# Patient Record
Sex: Male | Born: 1970 | Race: White | Hispanic: No | Marital: Married | Smoking: Current every day smoker
Health system: Southern US, Community
[De-identification: ages and names within clinical notes are randomized; demographics above are authoritative.]

## PROBLEM LIST (undated history)

## (undated) DIAGNOSIS — G43909 Migraine, unspecified, not intractable, without status migrainosus: Secondary | ICD-10-CM

## (undated) DIAGNOSIS — I10 Essential (primary) hypertension: Secondary | ICD-10-CM

## (undated) DIAGNOSIS — L309 Dermatitis, unspecified: Secondary | ICD-10-CM

## (undated) DIAGNOSIS — E559 Vitamin D deficiency, unspecified: Secondary | ICD-10-CM

## (undated) DIAGNOSIS — J449 Chronic obstructive pulmonary disease, unspecified: Secondary | ICD-10-CM

## (undated) HISTORY — DX: Dermatitis, unspecified: L30.9

## (undated) HISTORY — DX: Essential (primary) hypertension: I10

## (undated) HISTORY — DX: Migraine, unspecified, not intractable, without status migrainosus: G43.909

## (undated) HISTORY — DX: Vitamin D deficiency, unspecified: E55.9

---

## 2006-10-19 ENCOUNTER — Emergency Department: Payer: Self-pay | Admitting: Emergency Medicine

## 2009-04-24 HISTORY — PX: DENTAL SURGERY: SHX609

## 2010-05-20 ENCOUNTER — Emergency Department: Payer: Self-pay | Admitting: Emergency Medicine

## 2010-12-29 ENCOUNTER — Emergency Department: Payer: Self-pay | Admitting: Internal Medicine

## 2011-02-06 ENCOUNTER — Emergency Department: Payer: Self-pay | Admitting: Unknown Physician Specialty

## 2014-08-19 ENCOUNTER — Ambulatory Visit
Admit: 2014-08-19 | Disposition: A | Discharge status: Home / Self Care | Payer: Self-pay | Attending: Family Medicine | Admitting: Family Medicine

## 2015-04-05 ENCOUNTER — Other Ambulatory Visit: Payer: Self-pay

## 2015-04-05 MED ORDER — CHLORTHALIDONE 50 MG PO TABS: 50.0000 mg | ORAL_TABLET | Freq: Every day | ORAL | Status: DC

## 2015-04-05 MED ORDER — OMEPRAZOLE 20 MG PO CPDR: 20.0000 mg | DELAYED_RELEASE_CAPSULE | Freq: Every day | ORAL | Status: DC | PRN

## 2015-04-05 MED ORDER — AMLODIPINE BESYLATE 10 MG PO TABS: 10.0000 mg | ORAL_TABLET | Freq: Every day | ORAL | Status: DC

## 2015-04-05 NOTE — Telephone Encounter (Signed)
Patient has f/u scheduled for 12/20 but is completely out of his BP meds.

## 2015-04-05 NOTE — Telephone Encounter (Signed)
He was last seen March 2016; he does not appear compliant with medicines, as a 3 month supply was prescribed at that time I will prescribe medicines, as he has upcoming appt next week

## 2015-04-06 ENCOUNTER — Other Ambulatory Visit: Payer: Self-pay

## 2015-04-06 MED ORDER — PROPRANOLOL HCL ER 80 MG PO CP24: 80.0000 mg | ORAL_CAPSULE | Freq: Every day | ORAL | Status: DC

## 2015-04-06 NOTE — Telephone Encounter (Signed)
His other Bp meds were refilled yesterday except this one.

## 2015-04-08 DIAGNOSIS — L309 Dermatitis, unspecified: Secondary | ICD-10-CM | POA: Insufficient documentation

## 2015-04-08 DIAGNOSIS — I10 Essential (primary) hypertension: Secondary | ICD-10-CM | POA: Insufficient documentation

## 2015-04-08 DIAGNOSIS — E559 Vitamin D deficiency, unspecified: Secondary | ICD-10-CM | POA: Insufficient documentation

## 2015-04-09 ENCOUNTER — Encounter: Payer: Self-pay | Admitting: Family Medicine

## 2015-04-09 ENCOUNTER — Other Ambulatory Visit: Payer: Self-pay

## 2015-04-09 ENCOUNTER — Telehealth: Payer: Self-pay

## 2015-04-09 ENCOUNTER — Ambulatory Visit (INDEPENDENT_AMBULATORY_CARE_PROVIDER_SITE_OTHER): Payer: Commercial Managed Care - PPO | Admitting: Family Medicine

## 2015-04-09 VITALS — BP 173/117 | HR 75 | Temp 97.9°F | Ht 68.5 in | Wt 159.0 lb

## 2015-04-09 DIAGNOSIS — D696 Thrombocytopenia, unspecified: Secondary | ICD-10-CM

## 2015-04-09 DIAGNOSIS — R079 Chest pain, unspecified: Secondary | ICD-10-CM

## 2015-04-09 DIAGNOSIS — I10 Essential (primary) hypertension: Secondary | ICD-10-CM

## 2015-04-09 DIAGNOSIS — E559 Vitamin D deficiency, unspecified: Secondary | ICD-10-CM

## 2015-04-09 DIAGNOSIS — D709 Neutropenia, unspecified: Secondary | ICD-10-CM | POA: Diagnosis not present

## 2015-04-09 DIAGNOSIS — R9431 Abnormal electrocardiogram [ECG] [EKG]: Secondary | ICD-10-CM

## 2015-04-09 DIAGNOSIS — Z720 Tobacco use: Secondary | ICD-10-CM

## 2015-04-09 LAB — CBC WITH DIFFERENTIAL/PLATELET
Hematocrit: 49.2 % (ref 37.5–51.0)
Hemoglobin: 17.2 g/dL (ref 12.6–17.7)
Lymphocytes Absolute: 1.7 10*3/uL (ref 0.7–3.1)
Lymphs: 25 %
MCH: 34.5 pg — ABNORMAL HIGH (ref 26.6–33.0)
MCHC: 35 g/dL (ref 31.5–35.7)
MCV: 99 fL — ABNORMAL HIGH (ref 79–97)
MID (ABSOLUTE): 0.5 10*3/uL (ref 0.1–1.6)
MID: 7 %
NEUTROS ABS: 4.5 10*3/uL (ref 1.4–7.0)
Neutrophils: 68 %
Platelets: 222 10*3/uL (ref 150–379)
RBC: 4.99 x10E6/uL (ref 4.14–5.80)
RDW: 12.9 % (ref 12.3–15.4)
WBC: 6.7 10*3/uL (ref 3.4–10.8)

## 2015-04-09 LAB — LIPID PANEL PICCOLO, WAIVED
CHOL/HDL RATIO PICCOLO,WAIVE: 2.1 mg/dL
CHOLESTEROL PICCOLO, WAIVED: 139 mg/dL (ref <200)
HDL Chol Piccolo, Waived: 68 mg/dL (ref >59)
LDL Chol Calc Piccolo Waived: 62 mg/dL (ref <100)
TRIGLYCERIDES PICCOLO,WAIVED: 48 mg/dL (ref <150)
VLDL Chol Calc Piccolo,Waive: 10 mg/dL (ref <30)

## 2015-04-09 LAB — TROPONIN I: Troponin I: <0.01 ng/mL (ref 0.00–0.04)

## 2015-04-09 NOTE — Patient Instructions (Addendum)
Your goal blood pressure is less than 140 mmHg on top. Try to follow the DASH guidelines (DASH stands for Dietary Approaches to Stop Hypertension) Try to limit the sodium in your diet.  Ideally, consume less than 1.5 grams (less than 1,500mg ) per day. Do not add salt when cooking or at the table.  Check the sodium amount on labels when shopping, and choose items lower in sodium when given a choice. Avoid or limit foods that already contain a lot of sodium. Eat a diet rich in fruits and vegetables and whole grains. We'll have you see the cardiologist next week If you develop chest pain again, call 911 Avoid strenuous activity in the meantime Really try to quit smoking; smoking increases your risk of stroke and heart attack and raises your blood pressure  DASH Eating Plan DASH stands for "Dietary Approaches to Stop Hypertension." The DASH eating plan is a healthy eating plan that has been shown to reduce high blood pressure (hypertension). Additional health benefits may include reducing the risk of type 2 diabetes mellitus, heart disease, and stroke. The DASH eating plan may also help with weight loss. WHAT DO I NEED TO KNOW ABOUT THE DASH EATING PLAN? For the DASH eating plan, you will follow these general guidelines:  Choose foods with a percent daily value for sodium of less than 5% (as listed on the food label).  Use salt-free seasonings or herbs instead of table salt or sea salt.  Check with your health care provider or pharmacist before using salt substitutes.  Eat lower-sodium products, often labeled as "lower sodium" or "no salt added."  Eat fresh foods.  Eat more vegetables, fruits, and low-fat dairy products.  Choose whole grains. Look for the word "whole" as the first word in the ingredient list.  Choose fish and skinless chicken or [place]key more often than red meat. Limit fish, poultry, and meat to 6 oz (170 g) each day.  Limit sweets, desserts, sugars, and sugary  drinks.  Choose heart-healthy fats.  Limit cheese to 1 oz (28 g) per day.  Eat more home-cooked food and less restaurant, buffet, and fast food.  Limit fried foods.  Cook foods using methods other than frying.  Limit canned vegetables. If you do use them, rinse them well to decrease the sodium.  When eating at a restaurant, ask that your food be prepared with less salt, or no salt if possible. WHAT FOODS CAN I EAT? Seek help from a dietitian for individual calorie needs. Grains Whole grain or whole wheat bread. Brown rice. Whole grain or whole wheat pasta. Quinoa, bulgur, and whole grain cereals. Low-sodium cereals. Corn or whole wheat flour tortillas. Whole grain cornbread. Whole grain crackers. Low-sodium crackers. Vegetables Fresh or frozen vegetables (raw, steamed, roasted, or grilled). Low-sodium or reduced-sodium tomato and vegetable juices. Low-sodium or reduced-sodium tomato sauce and paste. Low-sodium or reduced-sodium canned vegetables.  Fruits All fresh, canned (in natural juice), or frozen fruits. Meat and Other Protein Products Ground beef (85% or leaner), grass-fed beef, or beef trimmed of fat. Skinless chicken or [place]key. Ground chicken or [place]key. Pork trimmed of fat. All fish and seafood. Eggs. Dried beans, peas, or lentils. Unsalted nuts and seeds. Unsalted canned beans. Dairy Low-fat dairy products, such as skim or 1% milk, 2% or reduced-fat cheeses, low-fat ricotta or cottage cheese, or plain low-fat yogurt. Low-sodium or reduced-sodium cheeses. Fats and Oils Tub margarines without trans fats. Light or reduced-fat mayonnaise and salad dressings (reduced sodium). Avocado. Safflower, olive, or canola  oils. Natural peanut or almond butter. Other Unsalted popcorn and pretzels. The items listed above may not be a complete list of recommended foods or beverages. Contact your dietitian for more options. WHAT FOODS ARE NOT RECOMMENDED? Grains White bread. White pasta.  White rice. Refined cornbread. Bagels and croissants. Crackers that contain trans fat. Vegetables Creamed or fried vegetables. Vegetables in a cheese sauce. Regular canned vegetables. Regular canned tomato sauce and paste. Regular tomato and vegetable juices. Fruits Dried fruits. Canned fruit in light or heavy syrup. Fruit juice. Meat and Other Protein Products Fatty cuts of meat. Ribs, chicken wings, bacon, sausage, bologna, salami, chitterlings, fatback, hot dogs, bratwurst, and packaged luncheon meats. Salted nuts and seeds. Canned beans with salt. Dairy Whole or 2% milk, cream, half-and-half, and cream cheese. Whole-fat or sweetened yogurt. Full-fat cheeses or blue cheese. Nondairy creamers and whipped toppings. Processed cheese, cheese spreads, or cheese curds. Condiments Onion and garlic salt, seasoned salt, table salt, and sea salt. Canned and packaged gravies. Worcestershire sauce. Tartar sauce. Barbecue sauce. Teriyaki sauce. Soy sauce, including reduced sodium. Steak sauce. Fish sauce. Oyster sauce. Cocktail sauce. Horseradish. Ketchup and mustard. Meat flavorings and tenderizers. Bouillon cubes. Hot sauce. Tabasco sauce. Marinades. Taco seasonings. Relishes. Fats and Oils Butter, stick margarine, lard, shortening, ghee, and bacon fat. Coconut, palm kernel, or palm oils. Regular salad dressings. Other Pickles and olives. Salted popcorn and pretzels. The items listed above may not be a complete list of foods and beverages to avoid. Contact your dietitian for more information. WHERE CAN I FIND MORE INFORMATION? National Heart, Lung, and Blood Institute: travelstabloid.com   This information is not intended to replace advice given to you by your health care provider. Make sure you discuss any questions you have with your health care provider.   Document Released: 03/30/2011 Document Revised: 05/01/2014 Document Reviewed: 02/12/2013 Elsevier Interactive  Patient Education [employer].

## 2015-04-09 NOTE — Progress Notes (Signed)
BP 173/117 mmHg  Pulse 75  Temp(Src) 97.9 F (36.6 C)  Ht 5' 8.5" (1.74 m)  Wt 159 lb (72.122 kg)  BMI 23.82 kg/m2  SpO2 100%   Subjective:    Patient ID: Anthony Cardenas., male    DOB: 11-06-1970, 44 y.o.   MRN: [id number]  HPI: Anthony Cardenas. is a 44 y.o. male  Chief Complaint  Patient presents with  . Hypertension    follow up for BP and med refills   No issues since last visit; he was apparently lost to follow-up; I received a phone note/refill request Dec 12th, out of medicines and patient made appt for Dec 20th; meds were refilled and he has back on his medicine for 3 days now and took medicines this morning Out of blood pressure pills for two weeks; his pressure was doing well until he ran out He could definitely tell that his blood pressure was getting worse; he felt dizzy last night; forehead felt warm; no headache; no vision changes We reviewed his old chart (we changed EMR systems more than 6 months ago, so all of his labs and imaging were in that system) He had a renal ultrasound; April, simple 2.1 cm cyst on left kidney, otherwise normal He does not get episodes of anger or facial flushing; has had episodes of heart beating fast He has had some chest pain in the last week; NO chest pain right now; no associated nausea; no shortness of breath; he has had two episodes of chest pain, each lasting about 30-40 minutes; the last episode was Tuesday (today is Friday) He continues to smoke; he says he cannot tolerate nicotine patch because heart will race; smoking 1/2 to 1 ppd; he is aware that smoking can lead to heart attacks and strokes; he tried Chantix but it gave him really vivid dreams; hard to sleep sometimes, so he quit the Chantix Used to lift a lot of weights, and did protein shakes and drinks, never did creatinine Very little hair on his arms and legs, he's always been that way, nothing new He has hx of very low vitamin D deficiency and dermatitis; that has not  be rechecked in many months  Relevant past medical, surgical, family and social history reviewed and updated as indicated. His mother has high blood pressure; the only member in the family with heart disease is a maternal grandmother Interim medical history since our last visit reviewed. Family History  Problem Relation Age of Onset  . Cancer Mother     lung  . COPD Mother   . Asthma Mother   . Hypertension Mother   . Cancer Paternal Grandfather     lung  . Heart disease Maternal Grandmother   . Hypertension Maternal Grandmother   . Stroke Maternal Grandmother   . Diabetes Neg Hx    Allergies and medications reviewed and updated.  Review of Systems  Cardiovascular: Positive for chest pain (a few times this past week; mild; left side of chest; 30-40 minutes; has happened in the past; has not seen cardiologist).  Per HPI unless specifically indicated above     Objective:    BP 173/117 mmHg  Pulse 75  Temp(Src) 97.9 F (36.6 C)  Ht 5' 8.5" (1.74 m)  Wt 159 lb (72.122 kg)  BMI 23.82 kg/m2  SpO2 100%  Wt Readings from Last 3 Encounters:  04/09/15 159 lb (72.122 kg)  07/02/14 156 lb (70.761 kg)    Today's Vitals   04/09/15  1019 04/09/15 1102  BP: 165/107 173/117  Pulse: 75 75  Temp: 97.9 F (36.6 C)   Height: 5' 8.5" (1.74 m)   Weight: 159 lb (72.122 kg)   SpO2: 100%    Physical Exam  Constitutional: He appears well-developed and well-nourished.  Eyes: EOM are normal. No scleral icterus.  Neck: No JVD present. No thyromegaly present.  Cardiovascular: Normal rate, regular rhythm and normal heart sounds.   No extrasystoles are present. PMI is not displaced.  Exam reveals no gallop.   No murmur heard. Pulmonary/Chest: Effort normal and breath sounds normal. He has no decreased breath sounds.  Abdominal: He exhibits no distension and no abdominal bruit. There is no tenderness.  Musculoskeletal: He exhibits no edema.  Neurological: He displays no tremor.  Reflex  Scores:      Patellar reflexes are 2+ on the right side and 2+ on the left side. Skin: Rash (mild dermatitis, scale and erythema, face similar to seborrhea) noted. No pallor.  Psychiatric: His mood appears not anxious. He does not exhibit a depressed mood.  Pleasant and cooperative      Assessment & Plan:   Problem List Items Addressed This Visit      Cardiovascular and Mediastinum   Hypertension (Chronic)    He has had the renal US and I personally reviewed that report today; he also had renal artery duplex, but I could not locate that result in the chart; we called over to [facility] and they sent over the fax, and it did NOT show any renal artery stenosis (no athero, no fibromuscular dysplasia); will check labs to r/o pheo since he describes some panic-type episodes; check renin and aldosterone and UPEP and SPEP, check EKG today and refer to cardiologist; patient has been back on his medicines now for 3 days; will continue same doses since he reports that his BP was well-controlled during the months he has been away until he ran out; stressed importance of quitting smoking      Relevant Orders   Comprehensive metabolic panel (Completed)   TSH (Completed)   Metanephrines, Urine, 24 hour   Catecholamines, fractionated, Urine, 24 hour   Aldosterone + renin activity w/ ratio (Completed)   Lipid Panel Piccolo, Waived (Completed)   Troponin I (Completed)   Microalbumin / creatinine urine ratio (Completed)     Other   Vitamin D deficiency disease    Check level today and replace if indicated; can cause dermatitis if significant deficiency      Relevant Orders   VITAMIN D 25 Hydroxy (Vit-D Deficiency, Fractures) (Completed)   Chest pain - Primary    Two episodes of chest pain this week; my suspicion is that when he ran out of medicine (including his beta-blocker) that he may have had some very high pressures, rebound tachycardia and could have suffered demand ischemia;  discussed with cardiologist; referral and stress test, cards says okay to wait until next week, getting stat trop I today and to hospital if positive; discussed with patient, reasons to call 911 reviewed      Relevant Orders   EKG 12-Lead (Completed)   Ambulatory referral to Cardiology   CBC With Differential/Platelet (Completed)   Comprehensive metabolic panel (Completed)   TSH (Completed)   Metanephrines, Urine, 24 hour   Catecholamines, fractionated, Urine, 24 hour   Aldosterone + renin activity w/ ratio (Completed)   Lipid Panel Piccolo, Waived (Completed)   Troponin I (Completed)   Tobacco abuse    Patient  is not ready to quit smoking unfortunately; he is aware that smoking can increase risk of heart attack and stroke; I am here to help if and when he is ready to stop smoking if he would like assistance       Other Visit Diagnoses    Neutropenia, unspecified type (HCC)        Relevant Orders    CBC With Differential/Platelet (Completed)    Thrombocytopenia (HCC)        Relevant Orders    CBC With Differential/Platelet (Completed)    Abnormal EKG        J point elevation, LVH; reviewed EKG with cardiologist by phone; he says okay for pt to wait until next week to be seen; stat trop I drawn, forward to cards       Follow up plan: Return in about 1 week (around 04/16/2015) for hypertension.  Face-to-face time with patient was more than 45 minutes, >50% time spent counseling and coordination of care; I personally spoke with staff at [facility] and I personally spoke with cardiologist about his case Orders Placed This Encounter  Procedures  . CBC With Differential/Platelet  . Comprehensive metabolic panel  . TSH  . Metanephrines, Urine, 24 hour  . Catecholamines, fractionated, Urine, 24 hour  . Aldosterone + renin activity w/ ratio  . Lipid Panel Piccolo, [place]  . Troponin I  . VITAMIN D 25 Hydroxy (Vit-D Deficiency, Fractures)  . Microalbumin /  creatinine urine ratio  . Comprehensive metabolic panel  . Aldosterone + renin activity w/ ratio  . TSH  . Troponin I  . VITAMIN D 25 Hydroxy (Vit-D Deficiency, Fractures)  . Comprehensive metabolic panel  . Microalbumin / creatinine urine ratio  . TSH  . VITAMIN D 25 Hydroxy (Vit-D Deficiency, Fractures)  . Microalbumin / creatinine urine ratio  . Ambulatory referral to Cardiology  . EKG 12-Lead  (I do not know why there are so many duplicates for some of the above labs. I originally only entered them once. There are multiples in there now as I complete my note, so I am having to sign off on them.) Only the trop I was STAT  An after-visit summary was printed and given to the patient at check-out.  Please see the patient instructions which may contain other information and recommendations beyond what is mentioned above in the assessment and plan.

## 2015-04-09 NOTE — Telephone Encounter (Signed)
l mom to confirm pt of treadmill appt 12/19 10:30

## 2015-04-09 NOTE — Assessment & Plan Note (Addendum)
Check level today and replace if indicated; can cause dermatitis if significant deficiency

## 2015-04-09 NOTE — Assessment & Plan Note (Addendum)
He has had the renal US and I personally reviewed that report today; he also had renal artery duplex, but I could not locate that result in the chart; we called over to [facility] and they sent over the fax, and it did NOT show any renal artery stenosis (no athero, no fibromuscular dysplasia); will check labs to r/o pheo since he describes some panic-type episodes; check renin and aldosterone and UPEP and SPEP, check EKG today and refer to cardiologist; patient has been back on his medicines now for 3 days; will continue same doses since he reports that his BP was well-controlled during the months he has been away until he ran out; stressed importance of quitting smoking

## 2015-04-10 LAB — COMPREHENSIVE METABOLIC PANEL
ALBUMIN: 5 g/dL (ref 3.5–5.5)
ALT: 18 IU/L (ref 0–44)
AST: 21 IU/L (ref 0–40)
Albumin/Globulin Ratio: 2.5 (ref 1.1–2.5)
Alkaline Phosphatase: 63 IU/L (ref 39–117)
BUN/Creatinine Ratio: 12 (ref 9–20)
BUN: 13 mg/dL (ref 6–24)
Bilirubin Total: 0.4 mg/dL (ref 0.0–1.2)
CALCIUM: 10.3 mg/dL — AB (ref 8.7–10.2)
CO2: 25 mmol/L (ref 18–29)
Chloride: 97 mmol/L (ref 96–106)
Creatinine, Ser: 1.08 mg/dL (ref 0.76–1.27)
GFR, EST AFRICAN AMERICAN: 97 mL/min/{1.73_m2} (ref >59)
GFR, EST NON AFRICAN AMERICAN: 84 mL/min/{1.73_m2} (ref >59)
GLOBULIN, TOTAL: 2 g/dL (ref 1.5–4.5)
Glucose: 81 mg/dL (ref 65–99)
POTASSIUM: 4.7 mmol/L (ref 3.5–5.2)
SODIUM: 141 mmol/L (ref 134–144)
TOTAL PROTEIN: 7 g/dL (ref 6.0–8.5)

## 2015-04-10 LAB — VITAMIN D 25 HYDROXY (VIT D DEFICIENCY, FRACTURES): Vit D, 25-Hydroxy: 12.2 ng/mL — ABNORMAL LOW (ref 30.0–100.0)

## 2015-04-10 LAB — MICROALBUMIN / CREATININE URINE RATIO: Creatinine, Urine: 56.3 mg/dL

## 2015-04-10 LAB — ALDOSTERONE + RENIN ACTIVITY W/ RATIO

## 2015-04-10 LAB — TSH: TSH: 2.24 u[IU]/mL (ref 0.450–4.500)

## 2015-04-10 LAB — TROPONIN I

## 2015-04-11 ENCOUNTER — Other Ambulatory Visit: Payer: Self-pay | Admitting: Family Medicine

## 2015-04-11 DIAGNOSIS — Z720 Tobacco use: Secondary | ICD-10-CM | POA: Insufficient documentation

## 2015-04-11 MED ORDER — VITAMIN D (ERGOCALCIFEROL) 1.25 MG (50000 UNIT) PO CAPS: 50000.0000 [IU] | ORAL_CAPSULE | ORAL | Status: DC

## 2015-04-11 NOTE — Assessment & Plan Note (Signed)
Two episodes of chest pain this week; my suspicion is that when he ran out of medicine (including his beta-blocker) that he may have had some very high pressures, rebound tachycardia and could have suffered demand ischemia; discussed with cardiologist; referral and stress test, cards says okay to wait until next week, getting stat trop I today and to hospital if positive; discussed with patient, reasons to call 911 reviewed

## 2015-04-11 NOTE — Assessment & Plan Note (Signed)
Patient is not ready to quit smoking unfortunately; he is aware that smoking can increase risk of heart attack and stroke; I am here to help if and when he is ready to stop smoking if he would like assistance

## 2015-04-13 ENCOUNTER — Ambulatory Visit: Payer: Self-pay | Admitting: Family Medicine

## 2015-04-13 ENCOUNTER — Encounter (INDEPENDENT_AMBULATORY_CARE_PROVIDER_SITE_OTHER): Payer: Commercial Managed Care - PPO

## 2015-04-13 ENCOUNTER — Telehealth: Payer: Self-pay

## 2015-04-13 MED ORDER — LOSARTAN POTASSIUM 50 MG PO TABS: 50.0000 mg | ORAL_TABLET | Freq: Every day | ORAL | Status: DC

## 2015-04-13 NOTE — Telephone Encounter (Signed)
Called and left Jasmine DecemberSharon, Charity fundraiserN, a voicemail asking for her to please return my call.

## 2015-04-13 NOTE — Telephone Encounter (Signed)
S/w [place], CMA, [facility] who states Dr. Sherie DonLada will add losartan 50mg  qd. Pt will have f/u visit w/Dr. Sherie DonLada next week and pt/PCP will contact us to reschedule GXT once BP controlled.

## 2015-04-13 NOTE — Telephone Encounter (Signed)
Please have patient ADD the additional blood pressure medicine, losartan, and reschedule with Dr. Kirke CorinArida ASAP Please let Dr. Jari SportsmanArida's office know we've added this medicine Have patient make f/u with me next week

## 2015-04-13 NOTE — Telephone Encounter (Signed)
Jasmine DecemberSharon returned my call. I let her know that Dr. Sherie DonLada started the patient on a new medication today, Losartan. I told Jasmine DecemberSharon that Dr. Sherie DonLada wants the patient to come in and follow-up with her next week. Jasmine DecemberSharon stated that in order for them to be able to do the stress test, the patient's diastolic number has to be under 100. So when he comes in next week and if his blood pressure is within range for the stress test, Jasmine DecemberSharon asked to have the patient give them a call to reschedule his test. I am still waiting on a returned call from the patient.

## 2015-04-13 NOTE — Telephone Encounter (Signed)
Left message on VM for Amy, CMA for Dr. Sherie DonLada, of pt elevated BP and inability to have GXT. Requested CB

## 2015-04-13 NOTE — Telephone Encounter (Signed)
Pt arrived for GXT this morning. His BP readings were 150/103 and 146/106.  Pt states he ran out of BP meds and was unable to afford refills.  He was without medications for two weeks. Refills submitted on 12/12 by PCP and pt had 12/16 OV w/Dr. Sherie DonLada. He takes amlodipine, chlorthalidone, and propranolol.  He has been at work since 4:30am and has smoked today. Unable to do GXT d/t BP. Education material given on low sodium diet, HTN, and smoking cessation. Will make  Dr. Sherie DonLada aware of BP issue and need to reschedule test. Forward to Dr. Kirke CorinArida

## 2015-04-13 NOTE — Telephone Encounter (Signed)
Patient returned call. I let him know that Dr. Sherie DonLada started him on Losartan and it was at his pharmacy, and that she wants him to reschedule his appointment with Dr. Kirke CorinArida asap. I also told the patient that Dr. Sherie DonLada wants him to schedule a follow-up visit with her next week. Patient stated he would call back tomorrow to schedule an appointment with Dr. Sherie DonLada because he needed to look at his schedule.

## 2015-04-13 NOTE — Telephone Encounter (Signed)
Called and left patient a voicemail asking for him to please return my call.  

## 2015-04-13 NOTE — Telephone Encounter (Signed)
Received CB from Amy w/[facility]. She will forward info to Dr. Sherie DonLada for further review and recommendations.

## 2015-04-13 NOTE — Telephone Encounter (Signed)
Patient and Jasmine DecemberSharon, RN with Dr. Jari SportsmanArida's office both left messages stating that they were unable to do his stress test today because his BP was still too high. It was 150/103 in the left arm and 146/102 in the right arm. They wanted to let you know and see if you wanted to make adjustments to his BP med. Dr. Bradly ChrisArdia's nurse is also letting Dr. Kirke CorinArida know.

## 2015-04-15 ENCOUNTER — Telehealth: Payer: Self-pay | Admitting: Family Medicine

## 2015-04-15 ENCOUNTER — Encounter: Payer: Self-pay | Admitting: *Deleted

## 2015-04-15 NOTE — Telephone Encounter (Signed)
Yes, but now it looks like they are trying to recontact him again to reschedule now that I've added the BP medicine

## 2015-04-15 NOTE — Telephone Encounter (Signed)
See letter from [facility]; they cannot reach patient; please try to reach him, if needed, leave msg to have him call them right away

## 2015-04-15 NOTE — Telephone Encounter (Signed)
Left message to call.

## 2015-04-15 NOTE — Telephone Encounter (Signed)
That's very interesting because he went there on the 20th for a stress test with Dr. Kirke CorinArida and they could not do it because of his BP being too high.

## 2015-04-15 NOTE — Telephone Encounter (Signed)
Patient notified, he will call them back, he was waiting on his updated work schedule.

## 2015-04-16 LAB — METANEPHRINES, URINE, 24 HOUR
METANEPHRINES 24H UR: 72 ug/(24.h) (ref 45–290)
Metaneph Total, Ur: 55 ug/L
Normetanephrine, 24H Ur: 203 ug/24 hr (ref 82–500)
Normetanephrine, Ur: 156 ug/L

## 2015-04-17 LAB — CATECHOLAMINES, FRACTIONATED, URINE, 24 HOUR
Dopamine , 24H Ur: 75 ug/24 hr (ref 0–510)
Dopamine, Rand Ur: 58 ug/L
Epinephrine, 24H Ur: 4 ug/24 hr (ref 0–20)
Epinephrine, Rand Ur: 3 ug/L
NOREPINEPH RAND UR: 21 ug/L
Norepinephrine, 24H Ur: 27 ug/24 hr (ref 0–135)

## 2015-04-22 LAB — ALDOSTERONE + RENIN ACTIVITY W/ RATIO: ALDOSTERONE: 9.8 ng/dL (ref 0.0–30.0)

## 2015-04-27 ENCOUNTER — Other Ambulatory Visit: Payer: Self-pay

## 2015-04-27 ENCOUNTER — Ambulatory Visit (INDEPENDENT_AMBULATORY_CARE_PROVIDER_SITE_OTHER): Payer: Commercial Managed Care - PPO

## 2015-04-27 DIAGNOSIS — R079 Chest pain, unspecified: Secondary | ICD-10-CM

## 2015-04-28 ENCOUNTER — Telehealth: Payer: Self-pay | Admitting: Family Medicine

## 2015-04-28 NOTE — Telephone Encounter (Signed)
Please let Anthony Buckleralph E Levels Jr. know that I'd like to see patient for an appointment here in the office for:  High blood pressure Please schedule a FIFTEEN visit with me in the next: week Fasting?  No Thank you, Dr. Sherie DonLada (He was supposed to be seen around Dec 23rd)

## 2015-04-29 ENCOUNTER — Telehealth: Payer: Self-pay

## 2015-04-29 LAB — EXERCISE TOLERANCE TEST
CHL CUP MPHR: 177 {beats}/min
CHL CUP RESTING HR STRESS: 82 {beats}/min
CSEPHR: 105 %
Estimated workload: 10.4 METS
Exercise duration (min): 9 min
Exercise duration (sec): 12 s
Peak HR: 187 {beats}/min

## 2015-04-29 NOTE — Telephone Encounter (Signed)
Please call patient with results of stress test.

## 2015-04-29 NOTE — Telephone Encounter (Signed)
Patient wants you or Dr. lada to call him regarding his stress test, if Dr. lada got them yet. He has to look at his schedule next week to see when he can book the f/u appt.

## 2015-04-29 NOTE — Telephone Encounter (Signed)
Routing to provider  

## 2015-04-29 NOTE — Telephone Encounter (Signed)
I spoke with their office, they said it is not finalized yet, but they will send a message to the provider and they will call the patient with the results.

## 2015-04-29 NOTE — Telephone Encounter (Signed)
Left vm on patient phone to call us back and schedule a 15 minute f/u for High blood pressure.

## 2015-04-29 NOTE — Telephone Encounter (Signed)
The status says "in process" and the interpretation and conclusions of the test are blank in what I reviewed; it doesn't look like it has been finalized; please call Dr. Jari SportsmanArida's office and find out if they can tell you if it was positive or negative

## 2015-04-29 NOTE — Telephone Encounter (Signed)
Patient notified that Cardiology will call him with the results. I advised him if he had not heard from them by Monday, to let us know.

## 2015-04-30 NOTE — Telephone Encounter (Signed)
Per verbal from Dr. Mariah MillingGollan, GXT was normal. Informed pt of results. Pt verbalized understanding with no further questions.

## 2015-05-04 ENCOUNTER — Other Ambulatory Visit: Payer: Self-pay

## 2015-05-04 MED ORDER — PROPRANOLOL HCL ER 80 MG PO CP24: 80.0000 mg | ORAL_CAPSULE | Freq: Every day | ORAL | Status: DC

## 2015-05-04 MED ORDER — LOSARTAN POTASSIUM 50 MG PO TABS: 50.0000 mg | ORAL_TABLET | Freq: Every day | ORAL | Status: DC

## 2015-05-04 MED ORDER — AMLODIPINE BESYLATE 10 MG PO TABS: 10.0000 mg | ORAL_TABLET | Freq: Every day | ORAL | Status: DC

## 2015-05-04 MED ORDER — CHLORTHALIDONE 50 MG PO TABS: 50.0000 mg | ORAL_TABLET | Freq: Every day | ORAL | Status: DC

## 2015-05-04 NOTE — Telephone Encounter (Signed)
He needs one more refill on his BP meds, he will have his work schedule later this week and will call and schedule his follow up appointment as soon as he gets it.

## 2015-05-04 NOTE — Telephone Encounter (Signed)
Rxs sent Limited for ones that needs labs 6 months for those that don't need labs

## 2015-05-18 ENCOUNTER — Ambulatory Visit (INDEPENDENT_AMBULATORY_CARE_PROVIDER_SITE_OTHER): Payer: Commercial Managed Care - PPO | Admitting: Family Medicine

## 2015-05-18 ENCOUNTER — Encounter: Payer: Self-pay | Admitting: Family Medicine

## 2015-05-18 VITALS — BP 120/83 | HR 69 | Temp 97.6°F | Wt 162.0 lb

## 2015-05-18 DIAGNOSIS — I10 Essential (primary) hypertension: Secondary | ICD-10-CM

## 2015-05-18 DIAGNOSIS — E559 Vitamin D deficiency, unspecified: Secondary | ICD-10-CM

## 2015-05-18 DIAGNOSIS — Z5181 Encounter for therapeutic drug level monitoring: Secondary | ICD-10-CM

## 2015-05-18 DIAGNOSIS — Z720 Tobacco use: Secondary | ICD-10-CM

## 2015-05-18 NOTE — Assessment & Plan Note (Signed)
Check BMP today 

## 2015-05-18 NOTE — Assessment & Plan Note (Signed)
Controlled today; I'm so proud of patient's dietary changes; continue current medicine; as he continues to watch his diet and limit sodium, we may be able to decrease his medicine; monitor and call if trending down

## 2015-05-18 NOTE — Assessment & Plan Note (Signed)
He is not ready to quit; I am here to help if/when he's ready; see AVS for numbers given and steps to take to quit

## 2015-05-18 NOTE — Progress Notes (Signed)
BP 120/83 mmHg  Pulse 69  Temp(Src) 97.6 F (36.4 C)  Wt 162 lb (73.483 kg)  SpO2 100%   Subjective:    Patient ID: Anthony Cardenas., male    DOB: Aug 05, 1970, 45 y.o.   MRN: [id number]  HPI: Anthony Cardenas. is a 45 y.o. male  Chief Complaint  Patient presents with  . Hypertension    follow up; he states he has been working on his diet, eating veggies at work  . Vit D    he wants to know if he needs to refill the Vit D    He is here for follow-up He is working on his diet; carrying fruits and veggies and fish to work; feeling better He has been checking the salt content on the website where he works; educated himself on how much salt in different foods He is not adding extra salt at home No headaches; no vision problems He had the stress test done; 111/81 there before he had it and it was negative Grandmother had high blood pressure and a few others He is still smoking, but not ready to quit He is thinking about getting a Tour manager; working long hours  Relevant past medical, surgical, family and social history reviewed and updated as indicated. Interim medical history since our last visit reviewed. Allergies and medications reviewed and updated.  Review of Systems  Eyes: Negative for visual disturbance.  Cardiovascular: Negative for leg swelling.  Neurological: Negative for headaches.   Per HPI unless specifically indicated above     Objective:    BP 120/83 mmHg  Pulse 69  Temp(Src) 97.6 F (36.4 C)  Wt 162 lb (73.483 kg)  SpO2 100%  Wt Readings from Last 3 Encounters:  05/18/15 162 lb (73.483 kg)  04/09/15 159 lb (72.122 kg)  07/02/14 156 lb (70.761 kg)    Physical Exam  Constitutional: He appears well-developed and well-nourished.  Cardiovascular: Normal rate and regular rhythm.   No extrasystoles are present.  Pulmonary/Chest: Effort normal and breath sounds normal.  Musculoskeletal: He exhibits no edema.  Neurological: He displays no tremor.   Reflex Scores:      Patellar reflexes are 2+ on the right side and 2+ on the left side. Skin: He is not diaphoretic.  Psychiatric: He has a normal mood and affect. His speech is normal and behavior is normal. Judgment and thought content normal. Cognition and memory are normal.    Results for orders placed or performed in visit on 04/27/15  Exercise Tolerance Test  Result Value Ref Range   Rest HR 82 bpm   Rest BP 111/81 mmHg   Exercise duration (min) 9 min   Exercise duration (sec) 12 sec   Estimated workload 10.4 METS   Peak HR 187 bpm   Peak BP 206/78 mmHg   MPHR 177 bpm   Percent HR 105 %   RPE        Assessment & Plan:   Problem List Items Addressed This Visit      Cardiovascular and Mediastinum   Hypertension (Chronic)    Controlled today; I'm so proud of patient's dietary changes; continue current medicine; as he continues to watch his diet and limit sodium, we may be able to decrease his medicine; monitor and call if trending down        Other   Vitamin D deficiency disease    Encouraged him to pick up the refill and take Rx vitamin D for 4 more weeks,  then 2000 iu daily for several months; we can recheck in the future (summer or fall)      Tobacco abuse    He is not ready to quit; I am here to help if/when he's ready; see AVS for numbers given and steps to take to quit      Medication monitoring encounter - Primary    Check BMP today      Relevant Orders   Basic Metabolic Panel (BMET)      Follow up plan: Return in about 3 months (around 08/16/2015) for blood pressure.  Face-to-face time with patient was more than 15 minutes, >50% time spent counseling and coordination of care An after-visit summary was printed and given to the patient at check-out.  Please see the patient instructions which may contain other information and recommendations beyond what is mentioned above in the assessment and plan. Orders Placed This Encounter  Procedures  . Basic  Metabolic Panel (BMET)

## 2015-05-18 NOTE — Assessment & Plan Note (Signed)
Encouraged him to pick up the refill and take Rx vitamin D for 4 more weeks, then 2000 iu daily for several months; we can recheck in the future (summer or fall)

## 2015-05-18 NOTE — Patient Instructions (Addendum)
Go ahead and get the refill of the vitamin D When finished, start taking 2,000 iu vitamin D3 once a day for several months and then recheck later I am thrilled with your diet changes  I do encourage you to quit smoking Call [phone] to sign up for smoking cessation classes You can call 1-800-QUIT-NOW to talk with a smoking cessation coach  I'm so glad you want to increase your activity level; start slow and build up very gradually  Monitor your blood pressure once a week or so and call me if you notice your pressures are trending down; we may be able to decrease your medicine  Steps to Quit Smoking  Smoking tobacco can be harmful to your health and can affect almost every organ in your body. Smoking puts you, and those around you, at risk for developing many serious chronic diseases. Quitting smoking is difficult, but it is one of the best things that you can do for your health. It is never too late to quit. WHAT ARE THE BENEFITS OF QUITTING SMOKING? When you quit smoking, you lower your risk of developing serious diseases and conditions, such as:  Lung cancer or lung disease, such as COPD.  Heart disease.  Stroke.  Heart attack.  Infertility.  Osteoporosis and bone fractures. Additionally, symptoms such as coughing, wheezing, and shortness of breath may get better when you quit. You may also find that you get sick less often because your body is stronger at fighting off colds and infections. If you are pregnant, quitting smoking can help to reduce your chances of having a baby of low birth weight. HOW DO I GET READY TO QUIT? When you decide to quit smoking, create a plan to make sure that you are successful. Before you quit:  Pick a date to quit. Set a date within the next two weeks to give you time to prepare.  Write down the reasons why you are quitting. Keep this list in places where you will see it often, such as on your bathroom mirror or in your car or wallet.  Identify  the people, places, things, and activities that make you want to smoke (triggers) and avoid them. Make sure to take these actions:  Throw away all cigarettes at home, at work, and in your car.  Throw away smoking accessories, such as Set designer.  Clean your car and make sure to empty the ashtray.  Clean your home, including curtains and carpets.  Tell your family, friends, and coworkers that you are quitting. Support from your loved ones can make quitting easier.  Talk with your health care provider about your options for quitting smoking.  Find out what treatment options are covered by your health insurance. WHAT STRATEGIES CAN I USE TO QUIT SMOKING?  Talk with your healthcare provider about different strategies to quit smoking. Some strategies include:  Quitting smoking altogether instead of gradually lessening how much you smoke over a period of time. Research shows that quitting "cold [place]" is more successful than gradually quitting.  Attending in-person counseling to help you build problem-solving skills. You are more likely to have success in quitting if you attend several counseling sessions. Even short sessions of 10 minutes can be effective.  Finding resources and support systems that can help you to quit smoking and remain smoke-free after you quit. These resources are most helpful when you use them often. They can include:  Online chats with a Veterinary surgeon.  Telephone quitlines.  Printed Materials engineer.  Support groups or group counseling.  Text messaging programs.  Mobile phone applications.  Taking medicines to help you quit smoking. (If you are pregnant or breastfeeding, talk with your health care provider first.) Some medicines contain nicotine and some do not. Both types of medicines help with cravings, but the medicines that include nicotine help to relieve withdrawal symptoms. Your health care provider may recommend:  Nicotine patches, gum, or  lozenges.  Nicotine inhalers or sprays.  Non-nicotine medicine that is taken by mouth. Talk with your health care provider about combining strategies, such as taking medicines while you are also receiving in-person counseling. Using these two strategies together makes you more likely to succeed in quitting than if you used either strategy on its own. If you are pregnant or breastfeeding, talk with your health care provider about finding counseling or other support strategies to quit smoking. Do not take medicine to help you quit smoking unless told to do so by your health care provider. WHAT THINGS CAN I DO TO MAKE IT EASIER TO QUIT? Quitting smoking might feel overwhelming at first, but there is a lot that you can do to make it easier. Take these important actions:  Reach out to your family and friends and ask that they support and encourage you during this time. Call telephone quitlines, reach out to support groups, or work with a counselor for support.  Ask people who smoke to avoid smoking around you.  Avoid places that trigger you to smoke, such as bars, parties, or smoke-break areas at work.  Spend time around people who do not smoke.  Lessen stress in your life, because stress can be a smoking trigger for some people. To lessen stress, try:  Exercising regularly.  Deep-breathing exercises.  Yoga.  Meditating.  Performing a body scan. This involves closing your eyes, scanning your body from head to toe, and noticing which parts of your body are particularly tense. Purposefully relax the muscles in those areas.  Download or purchase mobile phone or tablet apps (applications) that can help you stick to your quit plan by providing reminders, tips, and encouragement. There are many free apps, such as QuitGuide from the [employer] developer for Disease Control and Prevention). You can find other support for quitting smoking (smoking cessation) through smokefree.gov and other websites. HOW  WILL I FEEL WHEN I QUIT SMOKING? Within the first 24 hours of quitting smoking, you may start to feel some withdrawal symptoms. These symptoms are usually most noticeable 2-3 days after quitting, but they usually do not last beyond 2-3 weeks. Changes or symptoms that you might experience include:  Mood swings.  Restlessness, anxiety, or irritation.  Difficulty concentrating.  Dizziness.  Strong cravings for sugary foods in addition to nicotine.  Mild weight gain.  Constipation.  Nausea.  Coughing or a sore throat.  Changes in how your medicines work in your body.  A depressed mood.  Difficulty sleeping (insomnia). After the first 2-3 weeks of quitting, you may start to notice more positive results, such as:  Improved sense of smell and taste.  Decreased coughing and sore throat.  Slower heart rate.  Lower blood pressure.  Clearer skin.  The ability to breathe more easily.  Fewer sick days. Quitting smoking is very challenging for most people. Do not get discouraged if you are not successful the first time. Some people need to make many attempts to quit before they achieve long-term success. Do your best to stick to your quit plan, and talk with  your health care provider if you have any questions or concerns.   This information is not intended to replace advice given to you by your health care provider. Make sure you discuss any questions you have with your health care provider.   Document Released: 04/04/2001 Document Revised: 08/25/2014 Document Reviewed: 08/25/2014 Elsevier Interactive Patient Education [employer].

## 2015-05-19 LAB — BASIC METABOLIC PANEL
BUN/Creatinine Ratio: 10 (ref 9–20)
BUN: 11 mg/dL (ref 6–24)
CO2: 26 mmol/L (ref 18–29)
Calcium: 9.3 mg/dL (ref 8.7–10.2)
Chloride: 99 mmol/L (ref 96–106)
Creatinine, Ser: 1.07 mg/dL (ref 0.76–1.27)
GFR, EST AFRICAN AMERICAN: 98 mL/min/{1.73_m2} (ref >59)
GFR, EST NON AFRICAN AMERICAN: 85 mL/min/{1.73_m2} (ref >59)
Glucose: 89 mg/dL (ref 65–99)
POTASSIUM: 4.2 mmol/L (ref 3.5–5.2)
SODIUM: 140 mmol/L (ref 134–144)

## 2015-06-09 ENCOUNTER — Other Ambulatory Visit: Payer: Self-pay | Admitting: Family Medicine

## 2015-06-09 NOTE — Telephone Encounter (Signed)
Pt came in stated he needs refills on the following:  chlorthylidone Omeprazole Losartan  Pharm is [facility]. Thanks.

## 2015-06-09 NOTE — Telephone Encounter (Signed)
Routing to provider  

## 2015-06-09 NOTE — Telephone Encounter (Signed)
Last BMP and BP reviewed; Rxs approved

## 2015-08-17 ENCOUNTER — Ambulatory Visit: Payer: Commercial Managed Care - PPO | Admitting: Family Medicine

## 2015-08-24 ENCOUNTER — Encounter: Payer: Self-pay | Admitting: Family Medicine

## 2015-08-24 ENCOUNTER — Ambulatory Visit (INDEPENDENT_AMBULATORY_CARE_PROVIDER_SITE_OTHER): Payer: Commercial Managed Care - PPO | Admitting: Family Medicine

## 2015-08-24 VITALS — BP 126/82 | HR 86 | Temp 98.3°F | Resp 14 | Wt 164.7 lb

## 2015-08-24 DIAGNOSIS — E559 Vitamin D deficiency, unspecified: Secondary | ICD-10-CM | POA: Diagnosis not present

## 2015-08-24 DIAGNOSIS — F419 Anxiety disorder, unspecified: Secondary | ICD-10-CM | POA: Diagnosis not present

## 2015-08-24 DIAGNOSIS — I10 Essential (primary) hypertension: Secondary | ICD-10-CM

## 2015-08-24 DIAGNOSIS — Z720 Tobacco use: Secondary | ICD-10-CM

## 2015-08-24 DIAGNOSIS — L309 Dermatitis, unspecified: Secondary | ICD-10-CM
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
## 2015-08-24 NOTE — Patient Instructions (Addendum)
Try the DASH guidelines  Take 1000 iu vitamin D3 once a day  I do encourage you to quit smoking Call [phone] to sign up for smoking cessation classes You can call 1-800-QUIT-NOW to talk with a smoking cessation coach  Consider filing a report with the FDA about the Chantix VIPSaver.nlHttps://www.accessdata.fda.gov/scripts/medwatch/index.cfm?action=consumer.reporting1  Check out Psychology Today for information about counselors Rebecka Apleyharles [facility] [address]  [place], [place] [phone]    Try the Relaxation Response Steps to Elicit the Relaxation Response The following is the technique reprinted with permission from Dr. Billy FischerHerbert Benson's book The Relaxation Response pages 162-163 1. Sit quietly in a comfortable position. 2. Close your eyes. 3. Deeply relax all your muscles,  beginning at your feet and progressing up to your face.  Keep them relaxed. 4. Breathe through your nose.  Become aware of your breathing.  As you breathe out, say the word, "one"*,  silently to yourself. For example,  breathe in ... out, "one",- in .. out, "one", etc.  Breathe easily and naturally. 5. Continue for 10 to 20 minutes.  You may open your eyes to check the time, but do not use an alarm.  When you finish, sit quietly for several minutes,  at first with your eyes closed and later with your eyes opened.  Do not stand up for a few minutes. 6. Do not worry about whether you are successful  in achieving a deep level of relaxation.  Maintain a passive attitude and permit relaxation to occur at its own pace.  When distracting thoughts occur,  try to ignore them by not dwelling upon them  and return to repeating "one."  With practice, the response should come with little effort.  Practice the technique once or twice daily,  but not within two hours after any meal,  since the digestive processes seem to interfere with  the elicitation of the  Relaxation Response. * It is better to use a soothing, mellifluous sound, preferably with no meaning. or association, to avoid stimulation of unnecessary thoughts - a mantra. Steps to Quit Smoking  Smoking tobacco can be harmful to your health and can affect almost every organ in your body. Smoking puts you, and those around you, at risk for developing many serious chronic diseases. Quitting smoking is difficult, but it is one of the best things that you can do for your health. It is never too late to quit. WHAT ARE THE BENEFITS OF QUITTING SMOKING? When you quit smoking, you lower your risk of developing serious diseases and conditions, such as:  Lung cancer or lung disease, such as COPD.  Heart disease.  Stroke.  Heart attack.  Infertility.  Osteoporosis and bone fractures. Additionally, symptoms such as coughing, wheezing, and shortness of breath may get better when you quit. You may also find that you get sick less often because your body is stronger at fighting off colds and infections. If you are pregnant, quitting smoking can help to reduce your chances of having a baby of low birth weight. HOW DO I GET READY TO QUIT? When you decide to quit smoking, create a plan to make sure that you are successful. Before you quit:  Pick a date to quit. Set a date within the next two weeks to give you time to prepare.  Write down the reasons why you are quitting. Keep this list in places where you will see it often, such as on your bathroom mirror or  in your car or wallet.  Identify the people, places, things, and activities that make you want to smoke (triggers) and avoid them. Make sure to take these actions:  Throw away all cigarettes at home, at work, and in your car.  Throw away smoking accessories, such as Set designer.  Clean your car and make sure to empty the ashtray.  Clean your home, including curtains and carpets.  Tell your family, friends, and coworkers that you  are quitting. Support from your loved ones can make quitting easier.  Talk with your health care provider about your options for quitting smoking.  Find out what treatment options are covered by your health insurance. WHAT STRATEGIES CAN I USE TO QUIT SMOKING?  Talk with your healthcare provider about different strategies to quit smoking. Some strategies include:  Quitting smoking altogether instead of gradually lessening how much you smoke over a period of time. Research shows that quitting "cold [place]" is more successful than gradually quitting.  Attending in-person counseling to help you build problem-solving skills. You are more likely to have success in quitting if you attend several counseling sessions. Even short sessions of 10 minutes can be effective.  Finding resources and support systems that can help you to quit smoking and remain smoke-free after you quit. These resources are most helpful when you use them often. They can include:  Online chats with a Veterinary surgeon.  Telephone quitlines.  Printed Materials engineer.  Support groups or group counseling.  Text messaging programs.  Mobile phone applications.  Taking medicines to help you quit smoking. (If you are pregnant or breastfeeding, talk with your health care provider first.) Some medicines contain nicotine and some do not. Both types of medicines help with cravings, but the medicines that include nicotine help to relieve withdrawal symptoms. Your health care provider may recommend:  Nicotine patches, gum, or lozenges.  Nicotine inhalers or sprays.  Non-nicotine medicine that is taken by mouth. Talk with your health care provider about combining strategies, such as taking medicines while you are also receiving in-person counseling. Using these two strategies together makes you more likely to succeed in quitting than if you used either strategy on its own. If you are pregnant or breastfeeding, talk with your health  care provider about finding counseling or other support strategies to quit smoking. Do not take medicine to help you quit smoking unless told to do so by your health care provider. WHAT THINGS CAN I DO TO MAKE IT EASIER TO QUIT? Quitting smoking might feel overwhelming at first, but there is a lot that you can do to make it easier. Take these important actions:  Reach out to your family and friends and ask that they support and encourage you during this time. Call telephone quitlines, reach out to support groups, or work with a counselor for support.  Ask people who smoke to avoid smoking around you.  Avoid places that trigger you to smoke, such as bars, parties, or smoke-break areas at work.  Spend time around people who do not smoke.  Lessen stress in your life, because stress can be a smoking trigger for some people. To lessen stress, try:  Exercising regularly.  Deep-breathing exercises.  Yoga.  Meditating.  Performing a body scan. This involves closing your eyes, scanning your body from head to toe, and noticing which parts of your body are particularly tense. Purposefully relax the muscles in those areas.  Download or purchase mobile phone or tablet apps (applications) that can help  you stick to your quit plan by providing reminders, tips, and encouragement. There are many free apps, such as QuitGuide from the [employer] developer for Disease Control and Prevention). You can find other support for quitting smoking (smoking cessation) through smokefree.gov and other websites. HOW WILL I FEEL WHEN I QUIT SMOKING? Within the first 24 hours of quitting smoking, you may start to feel some withdrawal symptoms. These symptoms are usually most noticeable 2-3 days after quitting, but they usually do not last beyond 2-3 weeks. Changes or symptoms that you might experience include:  Mood swings.  Restlessness, anxiety, or irritation.  Difficulty concentrating.  Dizziness.  Strong cravings for  sugary foods in addition to nicotine.  Mild weight gain.  Constipation.  Nausea.  Coughing or a sore throat.  Changes in how your medicines work in your body.  A depressed mood.  Difficulty sleeping (insomnia). After the first 2-3 weeks of quitting, you may start to notice more positive results, such as:  Improved sense of smell and taste.  Decreased coughing and sore throat.  Slower heart rate.  Lower blood pressure.  Clearer skin.  The ability to breathe more easily.  Fewer sick days. Quitting smoking is very challenging for most people. Do not get discouraged if you are not successful the first time. Some people need to make many attempts to quit before they achieve long-term success. Do your best to stick to your quit plan, and talk with your health care provider if you have any questions or concerns.   This information is not intended to replace advice given to you by your health care provider. Make sure you discuss any questions you have with your health care provider.   Document Released: 04/04/2001 Document Revised: 08/25/2014 Document Reviewed: 08/25/2014 Elsevier Interactive Patient Education [employer]

## 2015-09-12 DIAGNOSIS — F419 Anxiety disorder, unspecified: Secondary | ICD-10-CM | POA: Insufficient documentation

## 2015-09-12 NOTE — Assessment & Plan Note (Signed)
Chronic; lotion, vit D supplementation

## 2015-09-12 NOTE — Assessment & Plan Note (Signed)
Pt not ready to quit; see AVS, helpful hints given, I am here to help if/when he is ready to quit; he had side effects with Chantix and I encouraged him to file report through MedWatch

## 2015-09-12 NOTE — Assessment & Plan Note (Signed)
Well-controlled today; continue medications, try to follow DASH guidelines; reviewed previous work-up; smoking cessation would be helpful

## 2015-09-12 NOTE — Assessment & Plan Note (Signed)
Vitamin D supplementation 

## 2015-09-12 NOTE — Assessment & Plan Note (Signed)
After taking Chantix; encoruaged patient to work with a Veterinary surgeoncounselor; see AVS for Relaxation Response, which was also recommended

## 2015-11-17 ENCOUNTER — Other Ambulatory Visit: Payer: Self-pay | Admitting: Family Medicine

## 2015-12-21 ENCOUNTER — Other Ambulatory Visit: Payer: Self-pay

## 2015-12-21 MED ORDER — AMLODIPINE BESYLATE 10 MG PO TABS: 10.0000 mg | ORAL_TABLET | Freq: Every day | ORAL | 5 refills | Status: DC

## 2016-02-29 ENCOUNTER — Ambulatory Visit: Payer: Commercial Managed Care - PPO | Admitting: Family Medicine

## 2016-03-14 ENCOUNTER — Ambulatory Visit (INDEPENDENT_AMBULATORY_CARE_PROVIDER_SITE_OTHER): Payer: Commercial Managed Care - PPO | Admitting: Family Medicine

## 2016-03-14 ENCOUNTER — Encounter: Payer: Self-pay | Admitting: Family Medicine

## 2016-03-14 DIAGNOSIS — G8929 Other chronic pain: Secondary | ICD-10-CM

## 2016-03-14 DIAGNOSIS — Z5181 Encounter for therapeutic drug level monitoring: Secondary | ICD-10-CM

## 2016-03-14 DIAGNOSIS — Z720 Tobacco use: Secondary | ICD-10-CM | POA: Diagnosis not present

## 2016-03-14 DIAGNOSIS — I10 Essential (primary) hypertension: Secondary | ICD-10-CM | POA: Diagnosis not present

## 2016-03-14 DIAGNOSIS — E559 Vitamin D deficiency, unspecified: Secondary | ICD-10-CM | POA: Diagnosis not present

## 2016-03-14 DIAGNOSIS — M25571 Pain in right ankle and joints of right foot: Secondary | ICD-10-CM | POA: Diagnosis not present

## 2016-03-14 MED ORDER — PROPRANOLOL HCL ER 80 MG PO CP24: 80.0000 mg | ORAL_CAPSULE | Freq: Every day | ORAL | 11 refills | Status: DC

## 2016-03-14 MED ORDER — AMLODIPINE BESYLATE 10 MG PO TABS: 10.0000 mg | ORAL_TABLET | Freq: Every day | ORAL | 11 refills | Status: DC

## 2016-03-14 NOTE — Assessment & Plan Note (Addendum)
Check labs in January and supplement if needed; continue 2000 iu daily for now

## 2016-03-14 NOTE — Assessment & Plan Note (Signed)
Patient will try exercise; open invitation in the chart for ortho referral if desired

## 2016-03-14 NOTE — Assessment & Plan Note (Signed)
Encouraged smoking cessation; see AVS 
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
## 2016-03-14 NOTE — Assessment & Plan Note (Addendum)
Continue to try DASH guidelines; smoking cessation encouraged; restart just the CCB and beta-blocker; monitor BP at home and call if over 140 and we'll add back the ARB and then thiazide if needed

## 2016-03-14 NOTE — Patient Instructions (Addendum)
I do encourage you to quit smoking Call [phone] to sign up for smoking cessation classes You can call 1-800-QUIT-NOW to talk with a smoking cessation coach  Try low sodium options Your goal blood pressure is less than 140 mmHg on top. Try to follow the DASH guidelines (DASH stands for Dietary Approaches to Stop Hypertension) Try to limit the sodium in your diet.  Ideally, consume less than 1.5 grams (less than 1,500mg ) per day. Do not add salt when cooking or at the table.  Check the sodium amount on labels when shopping, and choose items lower in sodium when given a choice. Avoid or limit foods that already contain a lot of sodium. Eat a diet rich in fruits and vegetables and whole grains.  Return for fasting labs on or after May 18, 2016

## 2016-03-14 NOTE — Assessment & Plan Note (Signed)
Check kidney function in Jan

## 2016-05-16 ENCOUNTER — Other Ambulatory Visit: Payer: Commercial Managed Care - PPO | Admitting: Family Medicine

## 2016-07-26 ENCOUNTER — Telehealth: Payer: Self-pay

## 2016-07-26 NOTE — Telephone Encounter (Signed)
Received letter from [employer] stating pt may be non-adherent to his medication propranolol  CAP 80 mg. Tried reaching out to pt but not able to leave voicemail; pt does not have an voicemail set up.

## 2016-09-12 ENCOUNTER — Ambulatory Visit: Payer: Commercial Managed Care - PPO | Admitting: Family Medicine

## 2017-05-15 ENCOUNTER — Other Ambulatory Visit: Payer: Self-pay

## 2017-05-15 ENCOUNTER — Ambulatory Visit: Payer: Self-pay

## 2017-05-15 DIAGNOSIS — G43909 Migraine, unspecified, not intractable, without status migrainosus: Secondary | ICD-10-CM | POA: Diagnosis not present

## 2017-05-15 DIAGNOSIS — Z888 Allergy status to other drugs, medicaments and biological substances status: Secondary | ICD-10-CM | POA: Diagnosis not present

## 2017-05-15 DIAGNOSIS — L040 Acute lymphadenitis of face, head and neck: Secondary | ICD-10-CM | POA: Diagnosis not present

## 2017-05-15 DIAGNOSIS — Z79899 Other long term (current) drug therapy: Secondary | ICD-10-CM | POA: Insufficient documentation

## 2017-05-15 DIAGNOSIS — L03221 Cellulitis of neck: Secondary | ICD-10-CM | POA: Insufficient documentation

## 2017-05-15 DIAGNOSIS — F17210 Nicotine dependence, cigarettes, uncomplicated: Secondary | ICD-10-CM | POA: Insufficient documentation

## 2017-05-15 DIAGNOSIS — I10 Essential (primary) hypertension: Secondary | ICD-10-CM | POA: Insufficient documentation

## 2017-05-15 DIAGNOSIS — I889 Nonspecific lymphadenitis, unspecified: Secondary | ICD-10-CM | POA: Diagnosis present

## 2017-05-15 DIAGNOSIS — E559 Vitamin D deficiency, unspecified: Secondary | ICD-10-CM | POA: Insufficient documentation

## 2017-05-15 LAB — CBC WITH DIFFERENTIAL/PLATELET
Basophils Absolute: 0 10*3/uL (ref 0–0.1)
Basophils Relative: 0 %
EOS ABS: 0.3 10*3/uL (ref 0–0.7)
Eosinophils Relative: 3 %
HEMATOCRIT: 46.5 % (ref 40.0–52.0)
HEMOGLOBIN: 16 g/dL (ref 13.0–18.0)
LYMPHS ABS: 0.7 10*3/uL — AB (ref 1.0–3.6)
Lymphocytes Relative: 6 %
MCH: 34.2 pg — AB (ref 26.0–34.0)
MCHC: 34.4 g/dL (ref 32.0–36.0)
MCV: 99.4 fL (ref 80.0–100.0)
MONOS PCT: 7 %
Monocytes Absolute: 0.9 10*3/uL (ref 0.2–1.0)
NEUTROS ABS: 10.5 10*3/uL — AB (ref 1.4–6.5)
NEUTROS PCT: 84 %
Platelets: 210 10*3/uL (ref 150–440)
RBC: 4.68 MIL/uL (ref 4.40–5.90)
RDW: 12.9 % (ref 11.5–14.5)
WBC: 12.6 10*3/uL — AB (ref 3.8–10.6)

## 2017-05-15 LAB — COMPREHENSIVE METABOLIC PANEL
ALBUMIN: 4.2 g/dL (ref 3.5–5.0)
ALK PHOS: 67 U/L (ref 38–126)
ALT: 16 U/L — AB (ref 17–63)
AST: 19 U/L (ref 15–41)
Anion gap: 8 (ref 5–15)
BILIRUBIN TOTAL: 0.7 mg/dL (ref 0.3–1.2)
BUN: 7 mg/dL (ref 6–20)
CO2: 28 mmol/L (ref 22–32)
CREATININE: 0.89 mg/dL (ref 0.61–1.24)
Calcium: 9.3 mg/dL (ref 8.9–10.3)
Chloride: 101 mmol/L (ref 101–111)
GFR calc non Af Amer: >60 mL/min (ref >60)
GLUCOSE: 140 mg/dL — AB (ref 65–99)
Potassium: 4 mmol/L (ref 3.5–5.1)
SODIUM: 137 mmol/L (ref 135–145)
TOTAL PROTEIN: 7.7 g/dL (ref 6.5–8.1)
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
## 2017-05-15 NOTE — ED Triage Notes (Addendum)
Pt presents to ED with swelling to left side of his neck. Pt states both sides of his neck were swollen about a week ago but the right side improved after a few days but left side has continued to get more swollen and painful. Pt states tonight around 2000-2030 the swelling has made it difficult to swallow. Possible fever at home. Clear speech and no increased work of breathing noted. Obvious swelling to left side of neck present.

## 2017-05-16 ENCOUNTER — Other Ambulatory Visit: Payer: Self-pay

## 2017-05-16 ENCOUNTER — Emergency Department: Payer: Commercial Managed Care - PPO

## 2017-05-16 ENCOUNTER — Observation Stay
Admission: EM | Admit: 2017-05-16 | Discharge: 2017-05-17 | Disposition: A | Discharge status: Home / Self Care | Payer: Commercial Managed Care - PPO | Attending: Internal Medicine | Admitting: Internal Medicine

## 2017-05-16 DIAGNOSIS — L049 Acute lymphadenitis, unspecified: Secondary | ICD-10-CM

## 2017-05-16 DIAGNOSIS — R221 Localized swelling, mass and lump, neck: Secondary | ICD-10-CM

## 2017-05-16 DIAGNOSIS — I889 Nonspecific lymphadenitis, unspecified: Secondary | ICD-10-CM | POA: Diagnosis present

## 2017-05-16 LAB — BASIC METABOLIC PANEL
Anion gap: 9 (ref 5–15)
BUN: 6 mg/dL (ref 6–20)
CALCIUM: 9.6 mg/dL (ref 8.9–10.3)
CHLORIDE: 99 mmol/L — AB (ref 101–111)
CO2: 28 mmol/L (ref 22–32)
CREATININE: 0.87 mg/dL (ref 0.61–1.24)
Glucose, Bld: 119 mg/dL — ABNORMAL HIGH (ref 65–99)
Potassium: 3.8 mmol/L (ref 3.5–5.1)
SODIUM: 136 mmol/L (ref 135–145)

## 2017-05-16 LAB — CBC
HCT: 49.3 % (ref 40.0–52.0)
HEMOGLOBIN: 16.7 g/dL (ref 13.0–18.0)
MCH: 33.7 pg (ref 26.0–34.0)
MCHC: 33.8 g/dL (ref 32.0–36.0)
MCV: 99.7 fL (ref 80.0–100.0)
PLATELETS: 217 10*3/uL (ref 150–440)
RBC: 4.95 MIL/uL (ref 4.40–5.90)
RDW: 13.1 % (ref 11.5–14.5)
WBC: 12.5 10*3/uL — ABNORMAL HIGH (ref 3.8–10.6)

## 2017-05-16 LAB — C-REACTIVE PROTEIN: CRP: 6 mg/dL — ABNORMAL HIGH (ref <1.0)

## 2017-05-16 LAB — SEDIMENTATION RATE: Sed Rate: 9 mm/hr (ref 0–15)

## 2017-05-16 MED ORDER — LOSARTAN POTASSIUM 50 MG PO TABS: 50.0000 mg | ORAL_TABLET | Freq: Every day | ORAL | Status: DC

## 2017-05-16 MED ORDER — SODIUM CHLORIDE 0.9 % IV SOLN
3.0000 g | Freq: Four times a day (QID) | INTRAVENOUS | Status: DC
  Administered 2017-05-16 – 2017-05-17 (×4): 3 g via INTRAVENOUS
  Filled 2017-05-16 (×7): qty 3

## 2017-05-16 MED ORDER — AMLODIPINE BESYLATE 10 MG PO TABS
10.0000 mg | ORAL_TABLET | Freq: Every day | ORAL | Status: DC
  Administered 2017-05-16 – 2017-05-17 (×2): 10 mg via ORAL
  Filled 2017-05-16 (×2): qty 1

## 2017-05-16 MED ORDER — VITAMIN D 1000 UNITS PO TABS
1000.0000 [IU] | ORAL_TABLET | Freq: Every day | ORAL | Status: DC
  Administered 2017-05-16 – 2017-05-17 (×2): 1000 [IU] via ORAL
  Filled 2017-05-16 (×2): qty 1

## 2017-05-16 MED ORDER — ONDANSETRON HCL 4 MG/2ML IJ SOLN: 4.0000 mg | Freq: Four times a day (QID) | INTRAMUSCULAR | Status: DC | PRN

## 2017-05-16 MED ORDER — ENOXAPARIN SODIUM 40 MG/0.4ML ~~LOC~~ SOLN
40.0000 mg | SUBCUTANEOUS | Status: DC
  Filled 2017-05-16: qty 0.4

## 2017-05-16 MED ORDER — CLINDAMYCIN PHOSPHATE 600 MG/50ML IV SOLN
600.0000 mg | Freq: Three times a day (TID) | INTRAVENOUS | Status: DC
  Administered 2017-05-16 – 2017-05-17 (×3): 600 mg via INTRAVENOUS
  Filled 2017-05-16 (×5): qty 50

## 2017-05-16 MED ORDER — CLINDAMYCIN PHOSPHATE 600 MG/50ML IV SOLN
600.0000 mg | Freq: Once | INTRAVENOUS | Status: AC
  Administered 2017-05-16: 600 mg via INTRAVENOUS
  Filled 2017-05-16: qty 50

## 2017-05-16 MED ORDER — DEXAMETHASONE SODIUM PHOSPHATE 10 MG/ML IJ SOLN
10.0000 mg | Freq: Once | INTRAMUSCULAR | Status: AC
  Administered 2017-05-16: 10 mg via INTRAVENOUS
  Filled 2017-05-16: qty 1

## 2017-05-16 MED ORDER — ACETAMINOPHEN 325 MG PO TABS: 650.0000 mg | ORAL_TABLET | Freq: Four times a day (QID) | ORAL | Status: DC | PRN

## 2017-05-16 MED ORDER — DEXAMETHASONE SODIUM PHOSPHATE 10 MG/ML IJ SOLN
10.0000 mg | Freq: Four times a day (QID) | INTRAMUSCULAR | Status: DC
  Administered 2017-05-16 – 2017-05-17 (×4): 10 mg via INTRAVENOUS
  Filled 2017-05-16 (×4): qty 1

## 2017-05-16 MED ORDER — IOPAMIDOL (ISOVUE-300) INJECTION 61%
75.0000 mL | Freq: Once | INTRAVENOUS | Status: AC | PRN
  Administered 2017-05-16: 75 mL via INTRAVENOUS

## 2017-05-16 MED ORDER — SODIUM CHLORIDE 0.9% FLUSH
3.0000 mL | Freq: Two times a day (BID) | INTRAVENOUS | Status: DC
  Administered 2017-05-16 – 2017-05-17 (×3): 3 mL via INTRAVENOUS

## 2017-05-16 MED ORDER — PANTOPRAZOLE SODIUM 40 MG PO TBEC
40.0000 mg | DELAYED_RELEASE_TABLET | Freq: Every day | ORAL | Status: DC
  Administered 2017-05-16 – 2017-05-17 (×2): 40 mg via ORAL
  Filled 2017-05-16 (×2): qty 1

## 2017-05-16 MED ORDER — CHLORTHALIDONE 50 MG PO TABS
50.0000 mg | ORAL_TABLET | Freq: Every day | ORAL | Status: DC
  Filled 2017-05-16: qty 1

## 2017-05-16 MED ORDER — SENNOSIDES-DOCUSATE SODIUM 8.6-50 MG PO TABS: 1.0000 | ORAL_TABLET | Freq: Every evening | ORAL | Status: DC | PRN

## 2017-05-16 MED ORDER — ONDANSETRON HCL 4 MG PO TABS: 4.0000 mg | ORAL_TABLET | Freq: Four times a day (QID) | ORAL | Status: DC | PRN

## 2017-05-16 MED ORDER — SODIUM CHLORIDE 0.9 % IV SOLN: 250.0000 mL | INTRAVENOUS | Status: DC | PRN

## 2017-05-16 MED ORDER — ACETAMINOPHEN 650 MG RE SUPP: 650.0000 mg | Freq: Four times a day (QID) | RECTAL | Status: DC | PRN

## 2017-05-16 MED ORDER — SODIUM CHLORIDE 0.9% FLUSH: 3.0000 mL | INTRAVENOUS | Status: DC | PRN

## 2017-05-16 MED ORDER — AMPICILLIN-SULBACTAM SODIUM 3 (2-1) G IJ SOLR
3.0000 g | INTRAMUSCULAR | Status: AC
  Administered 2017-05-16: 3 g via INTRAVENOUS
  Filled 2017-05-16: qty 3

## 2017-05-16 MED ORDER — HYDROCODONE-ACETAMINOPHEN 5-325 MG PO TABS: 1.0000 | ORAL_TABLET | ORAL | Status: DC | PRN

## 2017-05-16 MED ORDER — PROPRANOLOL HCL ER 80 MG PO CP24
80.0000 mg | ORAL_CAPSULE | Freq: Every day | ORAL | Status: DC
  Administered 2017-05-16 – 2017-05-17 (×2): 80 mg via ORAL
  Filled 2017-05-16 (×2): qty 1

## 2017-05-16 NOTE — Progress Notes (Signed)
Same day rounding note  47 year old male patient with history of hypertension, vitamin D deficiency, migraines admitted with swelling in the left side of the neck along with pain .   1  Bacterial cervical lymphadenitis: may be minor abscess per ENT eval - continue IV antibiotics and steroids as it's improving, with discharge once he continues to improve on oral antibiotics and steroids as an outpatient.  I am happy to follow-up with him in the office to monitor resolution of the lymph node swelling.  Needle aspiration under ultrasound guidance of the lymph node could be considered, but is most likely unnecessary given his rapid improvement.  It  2. Neck swelling: due to 1 3.  Left neck pain: due to 1 4.  Hypertension: continue Norvasc and inderal  Time spent: 15 mins

## 2017-05-16 NOTE — Consult Note (Signed)
Anthony Cardenas, Anthony Cardenas [id number] [birth year] [name], MD  Reason for Consult: Evaluate left neck mass Requesting Physician: Max Sane, MD Consulting Physician: [name]  HPI: This 47 y.o. year old male was admitted on 05/16/2017 for Localized swelling, mass or lump of neck [R22.1] Suppurative lymphadenitis [L04.9]. Anthony Cardenas  is a 47 y.o. male who presented to the emergency room with swelling in the left side of the neck which is been going on for the last 1-1/2-week.  Patient had swelling in both the right and left side of the neck initially after reporting upper respiratory symptoms of congestion and postnasal drainage, with some initial sore throat and low-grade fevers.  The swelling of the right side of the neck has resolved, but he developed worsening swelling in the left neck necessitating evaluation in the emergency room.  He was also having some new onset of swallowing difficulty associated with the swelling. He was evaluated in the emergency room with CT scan of the soft tissues of the neck which revealed swollen lymph nodes and cellulitis along with possible necrosis of 1 of the level 2 nodes on the left.   The radiologist favored this as an inflammatory lymphadenitis.  No other lesions of the airway were seen. Patient has gargled  at home for self care but has not been on any antibiotics.  He was admitted by the medicine service and given Unasyn antibiotic, and has been placed on clindamycin antibiotic and IV Decadron since admission.  He already notes significant improvement in the swelling, discomfort, and his swallowing has improved.  He denies any preceding swallowing complaints, hoarseness, or swelling in the neck prior to the onset of this upper respiratory infection.  He does have a long history of smoking.   Allergies:  Allergies  Allergen Reactions  . Chantix [Varenicline] Other (See Comments)    Bad dreams, anxiety    Medications:  Medications Prior to Admission   Medication Sig Dispense Refill  . amLODipine (NORVASC) 10 MG tablet Take 1 tablet (10 mg total) by mouth daily. 30 tablet 11  . omeprazole (PRILOSEC) 20 MG capsule Take 1 capsule (20 mg total) by mouth daily as needed. 14 capsule 1  . propranolol ER (INDERAL LA) 80 MG 24 hr capsule Take 1 capsule (80 mg total) by mouth daily. 30 capsule 11  . chlorthalidone (HYGROTON) 50 MG tablet Take 1 tablet (50 mg total) by mouth daily. (Patient not taking: Reported on 05/16/2017) 30 tablet 2  . cholecalciferol (VITAMIN D) 1000 units tablet Take 1,000 Units by mouth daily.    Marland Kitchen losartan (COZAAR) 50 MG tablet Take 1 tablet (50 mg total) by mouth daily. (Patient not taking: Reported on 05/16/2017) 30 tablet 2  .  Current Facility-Administered Medications  Medication Dose Route Frequency Provider Last Rate Last Dose  . 0.9 %  sodium chloride infusion  250 mL Intravenous PRN Pyreddy, Reatha Harps, MD      . acetaminophen (TYLENOL) tablet 650 mg  650 mg Oral Q6H PRN Pyreddy, Reatha Harps, MD       Or  . acetaminophen (TYLENOL) suppository 650 mg  650 mg Rectal Q6H PRN Pyreddy, Reatha Harps, MD      . amLODipine (NORVASC) tablet 10 mg  10 mg Oral Daily Pyreddy, Reatha Harps, MD   10 mg at 05/16/17 0815  . Ampicillin-Sulbactam (UNASYN) 3 g in sodium chloride 0.9 % 100 mL IVPB  3 g Intravenous Q6H Pyreddy, Pavan, MD      . cholecalciferol (VITAMIN D) tablet 1,000 Units  1,000 Units Oral Daily Saundra Shelling, MD   1,000 Units at 05/16/17 0815  . clindamycin (CLEOCIN) IVPB 600 mg  600 mg Intravenous Q8H Pyreddy, Pavan, MD      . dexamethasone (DECADRON) injection 10 mg  10 mg Intravenous Q6H Pyreddy, Pavan, MD      . enoxaparin (LOVENOX) injection 40 mg  40 mg Subcutaneous Q24H Pyreddy, Pavan, MD      . HYDROcodone-acetaminophen (NORCO/VICODIN) 5-325 MG per tablet 1-2 tablet  1-2 tablet Oral Q4H PRN Pyreddy, Reatha Harps, MD      . ondansetron (ZOFRAN) tablet 4 mg  4 mg Oral Q6H PRN Pyreddy, Reatha Harps, MD       Or  . ondansetron (ZOFRAN) injection 4 mg   4 mg Intravenous Q6H PRN Pyreddy, Pavan, MD      . pantoprazole (PROTONIX) EC tablet 40 mg  40 mg Oral Daily Pyreddy, Reatha Harps, MD   40 mg at 05/16/17 0815  . propranolol ER (INDERAL LA) 24 hr capsule 80 mg  80 mg Oral Daily Pyreddy, Reatha Harps, MD   80 mg at 05/16/17 0815  . senna-docusate (Senokot-S) tablet 1 tablet  1 tablet Oral QHS PRN Pyreddy, Reatha Harps, MD      . sodium chloride flush (NS) 0.9 % injection 3 mL  3 mL Intravenous Q12H Pyreddy, Reatha Harps, MD   3 mL at 05/16/17 0815  . sodium chloride flush (NS) 0.9 % injection 3 mL  3 mL Intravenous PRN Saundra Shelling, MD        PMH:  Past Medical History:  Diagnosis Date  . Dermatitis   . Hypertension   . Migraines   . Vitamin D deficiency disease    9.1 in Feb 2016    Fam Hx:  Family History  Problem Relation Age of Onset  . Cancer Mother        lung  . COPD Mother   . Asthma Mother   . Hypertension Mother   . Cancer Paternal Grandfather        lung  . Heart disease Maternal Grandmother   . Hypertension Maternal Grandmother   . Stroke Maternal Grandmother   . Diabetes Neg Hx     Soc Hx:  Social History   Socioeconomic History  . Marital status: Married    Spouse name: Not on file  . Number of children: Not on file  . Years of education: Not on file  . Highest education level: Not on file  Social Needs  . Financial resource strain: Not on file  . Food insecurity - worry: Not on file  . Food insecurity - inability: Not on file  . Transportation needs - medical: Not on file  . Transportation needs - non-medical: Not on file  Occupational History    Employer: [employer]  Tobacco Use  . Smoking status: Current Every Day Smoker    Packs/day: 1.00    Types: Cigarettes  . Smokeless tobacco: Never Used  Substance and Sexual Activity  . Alcohol use: Yes    Comment: occasionally  . Drug use: No  . Sexual activity: Yes  Other Topics Concern  . Not on file  Social History Narrative  . Not on file    PSH:  Past Surgical  History:  Procedure Laterality Date  . DENTAL SURGERY  2011  . Procedures since admission: No admission procedures for hospital encounter.  ROS: Review of systems normal other than 12 systems except per HPI.  PHYSICAL EXAM Vitals:  Vitals:   05/16/17 1497 05/16/17 0263  BP: (!) 145/90   Pulse:    Resp: 16   Temp: (!) 100.8 F (38.2 C) 100.2 F (37.9 C)  SpO2: 96%   . General: Well-developed, Well-nourished in no acute distress Mood: Mood and affect well adjusted, pleasant and cooperative. Orientation: Grossly alert and oriented. Vocal Quality: No hoarseness. Communicates verbally. head and Face: NCAT. No facial asymmetry. No visible skin lesions. No significant facial scars. No tenderness with sinus percussion. Facial strength normal and symmetric. Ears: External ears with normal landmarks, no lesions. External auditory canals free of infection, cerumen impaction or lesions. Tympanic membranes intact with good landmarks and normal mobility on pneumatic otoscopy. No middle ear effusion. Hearing: Speech reception grossly normal. Nose: External nose normal with midline dorsum and no lesions or deformity. Nasal Cavity reveals essentially midline septum with normal inferior turbinates. No significant mucosal congestion or erythema. Nasal secretions are minimal and clear. No polyps seen on anterior rhinoscopy. Oral Cavity/ Oropharynx: Lips are normal with no lesions.  He is edentulous. Gingiva healthy with no lesions or gingivitis. Oropharynx including tongue, buccal mucosa, floor of mouth, hard and soft palate, uvula and posterior pharynx free of exudates, erythema or lesions with normal symmetry and hydration.  Indirect Laryngoscopy/Nasopharyngoscopy: Visualization of the larynx, hypopharynx and nasopharynx is not possible in this setting with routine examination. Neck: The left neck is tender with cervical lymphadenopathy but no palpable fluctuance.  This involves the left level 2 and 3  region.  There is no significant palpable adenopathy in the right neck.  There is no erythema of the overlying skin. Lymphatic: Cervical lymph nodes are without palpable lymphadenopathy or tenderness. Respiratory: Normal respiratory effort without labored breathing. Cardiovascular: Carotid pulse shows regular rate and rhythm Neurologic: Cranial Nerves II through XII are grossly intact. Eyes: Gaze and Ocular Motility are grossly normal. PERRLA. No visible nystagmus.  MEDICAL DECISION MAKING: Data Review:  Results for orders placed or performed during the hospital encounter of 05/16/17 (from the past 48 hour(s))  CBC with Differential     Status: Abnormal   Collection Time: 05/15/17 11:16 PM  Result Value Ref Range   WBC 12.6 (H) 3.8 - 10.6 K/uL   RBC 4.68 4.40 - 5.90 MIL/uL   Hemoglobin 16.0 13.0 - 18.0 g/dL   HCT 46.5 40.0 - 52.0 %   MCV 99.4 80.0 - 100.0 fL   MCH 34.2 (H) 26.0 - 34.0 pg   MCHC 34.4 32.0 - 36.0 g/dL   RDW 12.9 11.5 - 14.5 %   Platelets 210 150 - 440 K/uL   Neutrophils Relative % 84 %   Neutro Abs 10.5 (H) 1.4 - 6.5 K/uL   Lymphocytes Relative 6 %   Lymphs Abs 0.7 (L) 1.0 - 3.6 K/uL   Monocytes Relative 7 %   Monocytes Absolute 0.9 0.2 - 1.0 K/uL   Eosinophils Relative 3 %   Eosinophils Absolute 0.3 0 - 0.7 K/uL   Basophils Relative 0 %   Basophils Absolute 0.0 0 - 0.1 K/uL    Comment: Performed at [facility], [address] [place], [place] [address]  Comprehensive metabolic panel     Status: Abnormal   Collection Time: 05/15/17 11:16 PM  Result Value Ref Range   Sodium 137 135 - 145 mmol/L   Potassium 4.0 3.5 - 5.1 mmol/L   Chloride 101 101 - 111 mmol/L   CO2 28 22 - 32 mmol/L   Glucose, Bld 140 (H) 65 - 99 mg/dL   BUN 7 6 -  20 mg/dL   Creatinine, Ser 0.89 0.61 - 1.24 mg/dL   Calcium 9.3 8.9 - 10.3 mg/dL   Total Protein 7.7 6.5 - 8.1 g/dL   Albumin 4.2 3.5 - 5.0 g/dL   AST 19 15 - 41 U/L   ALT 16 (L) 17 - 63 U/L   Alkaline Phosphatase 67  38 - 126 U/L   Total Bilirubin 0.7 0.3 - 1.2 mg/dL   GFR calc non Af Amer >60 >60 mL/min   GFR calc Af Amer >60 >60 mL/min    Comment: (NOTE) The eGFR has been calculated using the CKD EPI equation. This calculation has not been validated in all clinical situations. eGFR's persistently <60 mL/min signify possible Chronic Kidney Disease.    Anion gap 8 5 - 15    Comment: Performed at [facility], [address] [place], [place] [address]  Sedimentation rate     Status: None   Collection Time: 05/16/17  2:29 AM  Result Value Ref Range   Sed Rate 9 0 - 15 mm/hr    Comment: Performed at [facility], [address] [place], [place] [address]  C-reactive protein     Status: Abnormal   Collection Time: 05/16/17  2:29 AM  Result Value Ref Range   CRP 6.0 (H) <1.0 mg/dL    Comment: Performed at [facility], [address], [place], [place] [address]  CBC     Status: Abnormal   Collection Time: 05/16/17  6:35 AM  Result Value Ref Range   WBC 12.5 (H) 3.8 - 10.6 K/uL   RBC 4.95 4.40 - 5.90 MIL/uL   Hemoglobin 16.7 13.0 - 18.0 g/dL   HCT 49.3 40.0 - 52.0 %   MCV 99.7 80.0 - 100.0 fL   MCH 33.7 26.0 - 34.0 pg   MCHC 33.8 32.0 - 36.0 g/dL   RDW 13.1 11.5 - 14.5 %   Platelets 217 150 - 440 K/uL    Comment: Performed at [facility], [address] [place], [place] [address]  Basic metabolic panel     Status: Abnormal   Collection Time: 05/16/17  6:35 AM  Result Value Ref Range   Sodium 136 135 - 145 mmol/L   Potassium 3.8 3.5 - 5.1 mmol/L   Chloride 99 (L) 101 - 111 mmol/L   CO2 28 22 - 32 mmol/L   Glucose, Bld 119 (H) 65 - 99 mg/dL   BUN 6 6 - 20 mg/dL   Creatinine, Ser 0.87 0.61 - 1.24 mg/dL   Calcium 9.6 8.9 - 10.3 mg/dL   GFR calc non Af Amer >60 >60 mL/min   GFR calc Af Amer >60 >60 mL/min    Comment: (NOTE) The eGFR has been calculated using the CKD EPI equation. This calculation has not been validated in all clinical  situations. eGFR's persistently <60 mL/min signify possible Chronic Kidney Disease.    Anion gap 9 5 - 15    Comment: Performed at [facility], [address] [place], [place] [address]  . Ct Soft Tissue Neck W Contrast  Result Date: 05/16/2017 CLINICAL DATA:  Initial evaluation for acute left-sided neck swelling with pain, possible fever at home. EXAM: CT NECK WITH CONTRAST TECHNIQUE: Multidetector CT imaging of the neck was performed using the standard protocol following the bolus administration of intravenous contrast. CONTRAST:  64m ISOVUE-300 IOPAMIDOL (ISOVUE-300) INJECTION 61% COMPARISON:  None available. FINDINGS: Pharynx and larynx: Oral cavity within normal limits without mass lesion  or loculated fluid collection. Patient is edentulous. Palatine tonsils symmetric and within normal limits. Few scattered calcified tonsilliths noted. Nasopharynx within normal limits. No retropharyngeal collection or effusion. Epiglottis normal. Vallecula clear. Inflammatory stranding within the left parapharyngeal fat related to the inflammatory process within the left neck without significant mucosal edema at this time. Supraglottic airway remains patent. Piriform sinuses are clear. True cords symmetric and normal. Subglottic airway clear. Salivary glands: Inflammatory stranding surrounds the left parotid gland related to the inflammatory process within the left neck. The gland itself is felt to be within normal limits without evidence for acute parotitis. Right parotid gland normal. 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 Similarly, inflammatory stranding surrounds the left submandibular gland as well which is also felt to be within normal limits itself. Right submandibular gland normal. Thyroid: Thyroid normal. Lymph nodes: Multiple prominent left level II/III lymph nodes are seen within this region.  Most prominent of these seen at level II a and measures 2.7 x 1.5 cm (series 2, image 49). Irregular hypodensity within this node suggestive of necrosis. Extensive associated asymmetric soft tissue swelling with inflammatory stranding within the adjacent left lateral and anterior neck. Asymmetric thickening and irregularity of the left platysmas. Inflammatory changes extend to abut the inferior aspect of the left parotid gland, with extension into the left submandibular space to surround the left submandibular gland as well. Edema and phlegmon extend along the left sternocleidomastoid muscle without frank abscess. Inflammatory stranding extends into the left parapharyngeal space with involvement of the left parapharyngeal fat. Findings are of uncertain etiology, but favored to reflect sequelae of suppurative adenitis with associated cellulitis. No enlarged right-sided or supraclavicular adenopathy. Vascular: Normal intravascular enhancement seen throughout the neck. Left internal jugular vein compressed at the level of the left level 2 adenopathy but remains patent. Limited intracranial: Unremarkable. Visualized orbits: Visualized globes and orbital soft tissues are within normal limits. Mastoids and visualized paranasal sinuses: Visualized paranasal sinuses and mastoid air cells are clear. Middle ear cavities are well pneumatized and clear. Skeleton: No acute osseous abnormality. No worrisome lytic or blastic osseous lesions. Upper chest: Visualized upper mediastinum and chest within normal limits. Mild emphysematous changes noted within the partially visualized lungs. 4 mm right upper lobe nodule noted (series 5, image 42), indeterminate. Other: None. IMPRESSION: 1. Prominent and enlarged left level II/III adenopathy (one of which is necrotic in appearance) with associated inflammatory changes and swelling within the adjacent left neck. Given the history of fever, an infectious suppurative adenitis with associated  cellulitis is favored. Clinical follow-up to resolution is recommended as an underlying malignancy or nodal metastases are not entirely excluded. 2. Emphysema. 3. 4 mm right upper lobe pulmonary nodule, indeterminate. No follow-up needed if patient is low-risk. Non-contrast chest CT can be considered in 12 months if patient is high-risk. This recommendation follows the consensus statement: Guidelines for Management of Incidental Pulmonary Nodules Detected on CT Images: From the Fleischner Society 2017; Radiology 2017; 284:228-243. Electronically Signed   By: Jeannine Boga M.D.   On: 05/16/2017 04:04  .   PROCEDURE: Procedure: Diagnostic Fiberoptic Nasolaryngoscopy Diagnosis: Left neck mass, lymphadenitis, dysphagia Indications: Evaluate for any lesions that might be associated with the known left neck mass, and patient's complaint of dysphagia Findings: Nasal cavity was free of purulence.  The nasopharynx was free of any mucosal lesions.  The tongue base revealed no exudate or mucosal lesion.  There was minor erythema in the pharynx in general.  The hypopharynx and larynx are unremarkable.  True vocal cords are clear and mobile and without lesions.  The piriform sinus was well visualized and free of any mucosal lesion. Description of Procedure: After discussing procedure and risks  (primarily nose bleed) with the patient, the nose was anesthetized with topical Lidocaine 4% and decongested with phenylephrine. A flexible fiberoptic scope was passed through the nasal cavity. The nasal cavity was inspected and the scope passed through the Nasopharynx to the region of the hypopharynx and larynx. The patient was instructed to phonate to assess vocal cord mobility. The tongue was extended to evaluate the tongue base completely. Valsalva was performed to insufflate the hypopharynx for improved examination. Findings are as noted above. The scope was withdrawn. The patient tolerated the procedure  well.  ASSESSMENT: Patient with what appears to be bacterial cervical lymphadenitis.  The report of a "necrotic" lymph node most likely represents minor abscess formation.  Given his rapid improvement on IV antibiotics and steroids I see no need to do anything further.  There is no evidence of any primary tumor in the upper aerodigestive tract to suggest metastatic malignancy.  PLAN: Recommend continue IV antibiotics and steroids, with discharge once he continues to improve on oral antibiotics and steroids as an outpatient.  I am happy to follow-up with him in the office to monitor resolution of the lymph node swelling.  Needle aspiration under ultrasound guidance of the lymph node could be considered, but is most likely unnecessary given his rapid improvement.  It   [name], MD 05/16/2017 11:53 AM

## 2017-05-16 NOTE — H&P (Signed)
[facility] [place] [facility]   PATIENT NAME: Anthony LesserRalph Cardenas    MR#:  [id number]  DATE OF BIRTH:  [birth year]  DATE OF ADMISSION:  05/16/2017  PRIMARY CARE PHYSICIAN: Kerman PasseyLada, Melinda P, MD   REQUESTING/REFERRING PHYSICIAN:   CHIEF COMPLAINT:   Chief Complaint  Patient presents with  . Lymphadenopathy    HISTORY OF PRESENT ILLNESS: Anthony Cardenas  is a 47 y.o. male with a known history of hypertension, vitamin D deficiency, dermatitis, migraine presented to the emergency room with swelling in the left side of the neck which is been going on for the last 1-1/2-week.  Patient had swelling in both the right and left side of the neck.  The swelling of the right side of the neck has resolved.  He still has swelling, tenderness in the left side of the neck.  He had some chills but no fever.  He was evaluated in the emergency room with CT scan of the soft tissues of the neck which revealed swollen lymph nodes and cellulitis along with necrosis of the lymph nodes.  Patient has gargled case was discussed with ENT services on call [place] at home who recommended Unasyn antibiotic, clindamycin antibiotic and IV Decadron.  Hospitalist service was consulted.  PAST MEDICAL HISTORY:   Past Medical History:  Diagnosis Date  . Dermatitis   . Hypertension   . Migraines   . Vitamin D deficiency disease    9.1 in Feb 2016    PAST SURGICAL HISTORY:  Past Surgical History:  Procedure Laterality Date  . DENTAL SURGERY  2011    SOCIAL HISTORY:  Social History   Tobacco Use  . Smoking status: Current Every Day Smoker    Packs/day: 1.00    Types: Cigarettes  . Smokeless tobacco: Never Used  Substance Use Topics  . Alcohol use: Yes    Comment: occasionally    FAMILY HISTORY:  Family History  Problem Relation Age of Onset  . Cancer Mother        lung  . COPD Mother   . Asthma Mother   . Hypertension Mother   . Cancer Paternal Grandfather        lung  . Heart  disease Maternal Grandmother   . Hypertension Maternal Grandmother   . Stroke Maternal Grandmother   . Diabetes Neg Hx     DRUG ALLERGIES:  Allergies  Allergen Reactions  . Chantix [Varenicline] Other (See Comments)    Bad dreams, anxiety    REVIEW OF SYSTEMS:   CONSTITUTIONAL: No fever, fatigue or weakness.  EYES: No blurred or double vision.  EARS, NOSE, AND THROAT: No tinnitus or ear pain.  Swelling in the neck. RESPIRATORY: No cough, shortness of breath, wheezing or hemoptysis.  CARDIOVASCULAR: No chest pain, orthopnea, edema.  GASTROINTESTINAL: No nausea, vomiting, diarrhea or abdominal pain.  GENITOURINARY: No dysuria, hematuria.  ENDOCRINE: No polyuria, nocturia,  HEMATOLOGY: No anemia, easy bruising or bleeding SKIN: swelling in the neck. MUSCULOSKELETAL: No joint pain or arthritis.   NEUROLOGIC: No tingling, numbness, weakness.  PSYCHIATRY: No anxiety or depression.   MEDICATIONS AT HOME:  Prior to Admission medications   Medication Sig Start Date End Date Taking? Authorizing Provider  amLODipine (NORVASC) 10 MG tablet Take 1 tablet (10 mg total) by mouth daily. 03/14/16  Yes Lada, Janit BernMelinda P, MD  omeprazole (PRILOSEC) 20 MG capsule Take 1 capsule (20 mg total) by mouth daily as needed. 06/09/15  Yes Lada, Janit BernMelinda P, MD  propranolol ER (  INDERAL LA) 80 MG 24 hr capsule Take 1 capsule (80 mg total) by mouth daily. 03/14/16  Yes Lada, Janit Bern, MD  chlorthalidone (HYGROTON) 50 MG tablet Take 1 tablet (50 mg total) by mouth daily. Patient not taking: Reported on 05/16/2017 11/18/15   Kerman Passey, MD  cholecalciferol (VITAMIN D) 1000 units tablet Take 1,000 Units by mouth daily.    [provider]  losartan (COZAAR) 50 MG tablet Take 1 tablet (50 mg total) by mouth daily. Patient not taking: Reported on 05/16/2017 11/18/15   Kerman Passey, MD      PHYSICAL EXAMINATION:   VITAL SIGNS: Blood pressure (!) 145/94, pulse 78, temperature 99.6 F (37.6 C),  temperature source Oral, resp. rate 16, height 5\' 9"  (1.753 m), weight 74.8 kg (165 lb), SpO2 96 %.  GENERAL:  47 y.o.-year-old patient lying in the bed with no acute distress.  EYES: Pupils equal, round, reactive to light and accommodation. No scleral icterus. Extraocular muscles intact.  HEENT: Head atraumatic, normocephalic. Oropharynx and nasopharynx clear.  Lymph node Swelling in the left side of neck. Tenderness in the left side of neck. NECK:  Supple, no jugular venous distention. No thyroid enlargement, no tenderness.  LUNGS: Normal breath sounds bilaterally, no wheezing, rales,rhonchi or crepitation. No use of accessory muscles of respiration.  CARDIOVASCULAR: S1, S2 normal. No murmurs, rubs, or gallops.  ABDOMEN: Soft, nontender, nondistended. Bowel sounds present. No organomegaly or mass.  EXTREMITIES: No pedal edema, cyanosis, or clubbing.  NEUROLOGIC: Cranial nerves II through XII are intact. Muscle strength 5/5 in all extremities. Sensation intact. Gait not checked.  PSYCHIATRIC: The patient is alert and oriented x 3.  SKIN: No obvious rash, lesion, or ulcer.   LABORATORY PANEL:   CBC Recent Labs  Lab 05/15/17 2316  WBC 12.6*  HGB 16.0  HCT 46.5  PLT 210  MCV 99.4  MCH 34.2*  MCHC 34.4  RDW 12.9  LYMPHSABS 0.7*  MONOABS 0.9  EOSABS 0.3  BASOSABS 0.0   ------------------------------------------------------------------------------------------------------------------  Chemistries  Recent Labs  Lab 05/15/17 2316  NA 137  K 4.0  CL 101  CO2 28  GLUCOSE 140*  BUN 7  CREATININE 0.89  CALCIUM 9.3  AST 19  ALT 16*  ALKPHOS 67  BILITOT 0.7   ------------------------------------------------------------------------------------------------------------------ estimated creatinine clearance is 104.8 mL/min (by C-G formula based on SCr of 0.89  mg/dL). ------------------------------------------------------------------------------------------------------------------ No results for input(s): TSH, T4TOTAL, T3FREE, THYROIDAB in the last 72 hours.  Invalid input(s): FREET3   Coagulation profile No results for input(s): INR, PROTIME in the last 168 hours. ------------------------------------------------------------------------------------------------------------------- No results for input(s): DDIMER in the last 72 hours. -------------------------------------------------------------------------------------------------------------------  Cardiac Enzymes No results for input(s): CKMB, TROPONINI, MYOGLOBIN in the last 168 hours.  Invalid input(s): CK ------------------------------------------------------------------------------------------------------------------ Invalid input(s): POCBNP  ---------------------------------------------------------------------------------------------------------------  Urinalysis No results found for: COLORURINE, APPEARANCEUR, LABSPEC, PHURINE, GLUCOSEU, HGBUR, BILIRUBINUR, KETONESUR, PROTEINUR, UROBILINOGEN, NITRITE, LEUKOCYTESUR   RADIOLOGY: Ct Soft Tissue Neck W Contrast  Result Date: 05/16/2017 CLINICAL DATA:  Initial evaluation for acute left-sided neck swelling with pain, possible fever at home. EXAM: CT NECK WITH CONTRAST TECHNIQUE: Multidetector CT imaging of the neck was performed using the standard protocol following the bolus administration of intravenous contrast. CONTRAST:  75mL ISOVUE-300 IOPAMIDOL (ISOVUE-300) INJECTION 61% COMPARISON:  None available. FINDINGS: Pharynx and larynx: Oral cavity within normal limits without mass lesion or loculated fluid collection. Patient is edentulous. Palatine tonsils symmetric and within normal limits. Few scattered calcified tonsilliths noted. Nasopharynx within normal limits. No  retropharyngeal collection or effusion. Epiglottis normal. Vallecula clear.  Inflammatory stranding within the left parapharyngeal fat related to the inflammatory process within the left neck without significant mucosal edema at this time. Supraglottic airway remains patent. Piriform sinuses are clear. True cords symmetric and normal. Subglottic airway clear. Salivary glands: Inflammatory stranding surrounds the left parotid gland related to the inflammatory process within the left neck. The gland itself is felt to be within normal limits without evidence for acute parotitis. Right parotid gland normal. 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 Similarly, inflammatory stranding surrounds the left submandibular gland as well which is also felt to be within normal limits itself. Right submandibular gland normal. Thyroid: Thyroid normal. Lymph nodes: Multiple prominent left level II/III lymph nodes are seen within this region. Most prominent of these seen at level II a and measures 2.7 x 1.5 cm (series 2, image 49). Irregular hypodensity within this node suggestive of necrosis. Extensive associated asymmetric soft tissue swelling with inflammatory stranding within the adjacent left lateral and anterior neck. Asymmetric thickening and irregularity of the left platysmas. Inflammatory changes extend to abut the inferior aspect of the left parotid gland, with extension into the left submandibular space to surround the left submandibular gland as well. Edema and phlegmon extend along the left sternocleidomastoid muscle without frank abscess. Inflammatory stranding extends into the left parapharyngeal space with involvement of the left parapharyngeal fat. Findings are of uncertain etiology, but favored to reflect sequelae of suppurative adenitis with associated cellulitis. No enlarged right-sided or supraclavicular adenopathy. Vascular: Normal intravascular enhancement seen throughout the neck. Left  internal jugular vein compressed at the level of the left level 2 adenopathy but remains patent. Limited intracranial: Unremarkable. Visualized orbits: Visualized globes and orbital soft tissues are within normal limits. Mastoids and visualized paranasal sinuses: Visualized paranasal sinuses and mastoid air cells are clear. Middle ear cavities are well pneumatized and clear. Skeleton: No acute osseous abnormality. No worrisome lytic or blastic osseous lesions. Upper chest: Visualized upper mediastinum and chest within normal limits. Mild emphysematous changes noted within the partially visualized lungs. 4 mm right upper lobe nodule noted (series 5, image 42), indeterminate. Other: None. IMPRESSION: 1. Prominent and enlarged left level II/III adenopathy (one of which is necrotic in appearance) with associated inflammatory changes and swelling within the adjacent left neck. Given the history of fever, an infectious suppurative adenitis with associated cellulitis is favored. Clinical follow-up to resolution is recommended as an underlying malignancy or nodal metastases are not entirely excluded. 2. Emphysema. 3. 4 mm right upper lobe pulmonary nodule, indeterminate. No follow-up needed if patient is low-risk. Non-contrast chest CT can be considered in 12 months if patient is high-risk. This recommendation follows the consensus statement: Guidelines for Management of Incidental Pulmonary Nodules Detected on CT Images: From the Fleischner Society 2017; Radiology 2017; 284:228-243. Electronically Signed   By: Rise Mu M.D.   On: 05/16/2017 04:04    EKG: Orders placed or performed in visit on 04/09/15  . EKG 12-Lead    IMPRESSION AND PLAN: 47 year old male patient with history of hypertension, vitamin D deficiency, migraines presented to the emergency room with swelling in the left side of the neck along with pain .  Admitting diagnosis 1 cellulitis of left neck. 2.  Lymphadenitis of left neck 3.   Left neck pain 4.  Hypertension Treatment plan Admit patient to medical floor Start patient on IV  Unasyn antibiotic, IV clindamycin Start patient on IV Decadron ENT consult  All the records are reviewed and case discussed with ED provider. Management plans discussed with the patient, family and they are in agreement.  CODE STATUS:FULL CODE Code Status History    This patient does not have a recorded code status. Please follow your organizational policy for patients in this situation.       TOTAL TIME TAKING CARE OF THIS PATIENT: 50 minutes.    Ihor Austin M.D on 05/16/2017 at 5:13 AM  Between 7am to 6pm - Pager - [phone]  After 6pm go to [email or web address] - password [email or web address] [facility]  [facility]  Office  [phone]  CC: Primary care physician; Kerman Passey, MD

## 2017-05-16 NOTE — ED Provider Notes (Signed)
[facility] Emergency Department Provider Note  ____________________________________________   First MD Initiated Contact with Patient 05/16/17 0217     (approximate)  I have reviewed the triage vital signs and the nursing notes.   HISTORY  Chief Complaint Lymphadenopathy    HPI Anthony BucklerRalph E Ontiveros Jr. is a 47 y.o. male with a medical history that includes hypertension for which he takes a couple of different medications who presents for evaluation of gradually worsening left-sided neck swelling.  It first started about a week and a half ago with some swelling and tenderness on both sides of the neck.  He was gargling Listerine and the right side resolved.  The left side started to get better but then has steadily gotten worse over the last couple of days.  It is swollen enough now that he can feel it causing some difficulty swallowing.  It is very tender to the touch and soft.  He does not have a sore throat.  He wears dentures and has no teeth and is not been having any pain or swelling in his gums.  He had some objective chills a couple of nights ago but no regular fever or chills/rigors.  He has had no chest pain or shortness of breath.  He has had a mild cough recently and does smoke regularly.  He has a history of cancer in his family of various types but is uncertain about the specifics.  He denies abdominal pain, nausea, vomiting, dysuria, and diarrhea.  Scribes both pain and swelling is severe and nothing is making it better.   Past Medical History:  Diagnosis Date  . Dermatitis   . Hypertension   . Migraines   . Vitamin D deficiency disease    9.1 in Feb 2016    Patient Active Problem List   Diagnosis Date Noted  . Lymphadenitis 05/16/2017  . Chronic pain of right ankle 03/14/2016  . Anxiety 09/12/2015  . Medication monitoring encounter 05/18/2015  . Tobacco abuse 04/11/2015  . Hypertension   . Vitamin D deficiency disease   . Dermatitis      Past Surgical History:  Procedure Laterality Date  . DENTAL SURGERY  2011    Prior to Admission medications   Medication Sig Start Date End Date Taking? Authorizing Provider  amLODipine (NORVASC) 10 MG tablet Take 1 tablet (10 mg total) by mouth daily. 03/14/16   Kerman PasseyLada, Melinda P, MD  chlorthalidone (HYGROTON) 50 MG tablet Take 1 tablet (50 mg total) by mouth daily. 11/18/15   Kerman PasseyLada, Melinda P, MD  cholecalciferol (VITAMIN D) 1000 units tablet Take 1,000 Units by mouth daily.    [provider]  losartan (COZAAR) 50 MG tablet Take 1 tablet (50 mg total) by mouth daily. 11/18/15   Kerman PasseyLada, Melinda P, MD  omeprazole (PRILOSEC) 20 MG capsule Take 1 capsule (20 mg total) by mouth daily as needed. 06/09/15   Kerman PasseyLada, Melinda P, MD  propranolol ER (INDERAL LA) 80 MG 24 hr capsule Take 1 capsule (80 mg total) by mouth daily. 03/14/16   Kerman PasseyLada, Melinda P, MD    Allergies Chantix [varenicline]  Family History  Problem Relation Age of Onset  . Cancer Mother        lung  . COPD Mother   . Asthma Mother   . Hypertension Mother   . Cancer Paternal Grandfather        lung  . Heart disease Maternal Grandmother   . Hypertension Maternal Grandmother   . Stroke  Maternal Grandmother   . Diabetes Neg Hx     Social History Social History   Tobacco Use  . Smoking status: Current Every Day Smoker    Packs/day: 1.00    Types: Cigarettes  . Smokeless tobacco: Never Used  Substance Use Topics  . Alcohol use: Yes    Comment: occasionally  . Drug use: Not on file    Review of Systems Constitutional: Occasional subjective chills Eyes: No visual changes. ENT: Gradually worsening swelling of the left side of his neck causing some difficulty swallowing.  Tender to the touch.  No sore throat.  Edentulous. Cardiovascular: Denies chest pain. Respiratory: Denies shortness of breath.  Occasional cough Gastrointestinal: No abdominal pain.  No nausea, no vomiting.  No diarrhea.  No  constipation. Genitourinary: Negative for dysuria. Musculoskeletal: Negative for neck pain.  Negative for back pain. Integumentary: Negative for rash. Neurological: Negative for headaches, focal weakness or numbness.   ____________________________________________   PHYSICAL EXAM:  VITAL SIGNS: ED Triage Vitals [05/15/17 2214]  Enc Vitals Group     BP (!) 154/88     Pulse Rate 89     Resp 20     Temp 99.6 F (37.6 C)     Temp Source Oral     SpO2 98 %     Weight 74.8 kg (165 lb)     Height 1.753 m (5\' 9" )     Head Circumference      Peak Flow      Pain Score 8     Pain Loc      Pain Edu?      Excl. in GC?     Constitutional: Alert and oriented. Well appearing and in no acute distress. Eyes: Conjunctivae are normal.  Head: Atraumatic. Nose: No congestion/rhinnorhea. Mouth/Throat: Mucous membranes are moist.  Oropharynx non-erythematous without any evidence of oral pharyngeal abscess.  Edentulous.  Normal voice. Neck: No stridor.  No meningeal signs.  Obvious swelling with severe tenderness to palpation starting below the mandible on the left side and extending down most of his neck.  Soft to the touch, not indurated, does not feel consistent with fluctuance but difficult to appreciate given how tender he is. Cardiovascular: Normal rate, regular rhythm. Good peripheral circulation. Grossly normal heart sounds. Respiratory: Normal respiratory effort.  No retractions. Lungs CTAB. Gastrointestinal: Soft and nontender. No distention.  Musculoskeletal: No lower extremity tenderness nor edema. No gross deformities of extremities. Neurologic:  Normal speech and language. No gross focal neurologic deficits are appreciated.  Skin:  Skin is warm, dry and intact. No rash noted. Psychiatric: Mood and affect are normal. Speech and behavior are normal.  ____________________________________________   LABS (all labs ordered are listed, but only abnormal results are displayed)  Labs  Reviewed  CBC WITH DIFFERENTIAL/PLATELET - Abnormal; Notable for the following components:      Result Value   WBC 12.6 (*)    MCH 34.2 (*)    Neutro Abs 10.5 (*)    Lymphs Abs 0.7 (*)    All other components within normal limits  COMPREHENSIVE METABOLIC PANEL - Abnormal; Notable for the following components:   Glucose, Bld 140 (*)    ALT 16 (*)    All other components within normal limits  SEDIMENTATION RATE  C-REACTIVE PROTEIN   ____________________________________________  EKG  None - EKG not ordered by ED physician ____________________________________________  RADIOLOGY   Ct Soft Tissue Neck W Contrast  Result Date: 05/16/2017 CLINICAL DATA:  Initial evaluation for acute  left-sided neck swelling with pain, possible fever at home. EXAM: CT NECK WITH CONTRAST TECHNIQUE: Multidetector CT imaging of the neck was performed using the standard protocol following the bolus administration of intravenous contrast. CONTRAST:  75mL ISOVUE-300 IOPAMIDOL (ISOVUE-300) INJECTION 61% COMPARISON:  None available. FINDINGS: Pharynx and larynx: Oral cavity within normal limits without mass lesion or loculated fluid collection. Patient is edentulous. Palatine tonsils symmetric and within normal limits. Few scattered calcified tonsilliths noted. Nasopharynx within normal limits. No retropharyngeal collection or effusion. Epiglottis normal. Vallecula clear. Inflammatory stranding within the left parapharyngeal fat related to the inflammatory process within the left neck without significant mucosal edema at this time. Supraglottic airway remains patent. Piriform sinuses are clear. True cords symmetric and normal. Subglottic airway clear. Salivary glands: Inflammatory stranding surrounds the left parotid gland related to the inflammatory process within the left neck. The gland itself is felt to be within normal limits without evidence for acute parotitis. Right parotid gland normal. 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 Similarly, inflammatory stranding surrounds the left submandibular gland as well which is also felt to be within normal limits itself. Right submandibular gland normal. Thyroid: Thyroid normal. Lymph nodes: Multiple prominent left level II/III lymph nodes are seen within this region. Most prominent of these seen at level II a and measures 2.7 x 1.5 cm (series 2, image 49). Irregular hypodensity within this node suggestive of necrosis. Extensive associated asymmetric soft tissue swelling with inflammatory stranding within the adjacent left lateral and anterior neck. Asymmetric thickening and irregularity of the left platysmas. Inflammatory changes extend to abut the inferior aspect of the left parotid gland, with extension into the left submandibular space to surround the left submandibular gland as well. Edema and phlegmon extend along the left sternocleidomastoid muscle without frank abscess. Inflammatory stranding extends into the left parapharyngeal space with involvement of the left parapharyngeal fat. Findings are of uncertain etiology, but favored to reflect sequelae of suppurative adenitis with associated cellulitis. No enlarged right-sided or supraclavicular adenopathy. Vascular: Normal intravascular enhancement seen throughout the neck. Left internal jugular vein compressed at the level of the left level 2 adenopathy but remains patent. Limited intracranial: Unremarkable. Visualized orbits: Visualized globes and orbital soft tissues are within normal limits. Mastoids and visualized paranasal sinuses: Visualized paranasal sinuses and mastoid air cells are clear. Middle ear cavities are well pneumatized and clear. Skeleton: No acute osseous abnormality. No worrisome lytic or blastic osseous lesions. Upper chest: Visualized upper mediastinum and chest within normal limits. Mild emphysematous  changes noted within the partially visualized lungs. 4 mm right upper lobe nodule noted (series 5, image 42), indeterminate. Other: None. IMPRESSION: 1. Prominent and enlarged left level II/III adenopathy (one of which is necrotic in appearance) with associated inflammatory changes and swelling within the adjacent left neck. Given the history of fever, an infectious suppurative adenitis with associated cellulitis is favored. Clinical follow-up to resolution is recommended as an underlying malignancy or nodal metastases are not entirely excluded. 2. Emphysema. 3. 4 mm right upper lobe pulmonary nodule, indeterminate. No follow-up needed if patient is low-risk. Non-contrast chest CT can be considered in 12 months if patient is high-risk. This recommendation follows the consensus statement: Guidelines for Management of Incidental Pulmonary Nodules Detected on CT Images: From the Fleischner Society 2017; Radiology 2017; 284:228-243. Electronically Signed   By: Rise Mu M.D.   On: 05/16/2017 04:04  ____________________________________________   PROCEDURES  Critical Care performed: No   Procedure(s) performed:   Procedures   ____________________________________________   INITIAL IMPRESSION / ASSESSMENT AND PLAN / ED COURSE  As part of my medical decision making, I reviewed the following data within the electronic MEDICAL RECORD NUMBER Nursing notes reviewed and incorporated, Labs reviewed , Discussed with admitting physician  and A phone consult was requested and obtained from this/these consultant(s) ENT (Dr. Jenne Campus)    Differential diagnosis includes, but is not limited to, soft tissue neck abscess, neoplasm, reactive lymphadenopathy.  Neck infection from dental source very unlikely, no evidence of Ludwig's angina, peritonsillar abscess, or retropharyngeal abscess.  The patient is having some difficulty swallowing due to the mass-effect.  He has a very mild leukocytosis and a very  slightly elevated temperature that does not quite qualify as a fever.  He does not have tachycardia.  I will evaluate with a CT scan of his neck with IV contrast.  He is protecting his airway well at this time and the process has been worsening slowly over a couple of days, no indication for emergent steroids or advanced airway intervention.  I will reassess after the CT scan.  Clinical Course as of May 16 454  Wed May 16, 2017  0308 Sed Rate: 9 [CF]  [id number] Based on the suppurative adenitis seen on CT scan and the significant swelling in his neck, I contacted Dr. Jenne Campus for a phone consult.  We discussed the case by phone in detail.  He felt that the patient did need aggressive antibiotic and steroid treatment and that the patient could be treated either inpatient for 24 hours of aggressive antibiotics and steroids, or if the patient was comfortable with going home, with an aggressive outpatient regimen.  I discussed it with the patient and he prefers to stay for 24 hours of IV treatment and reassessment prior to discharge.  Dr. Mikey Bussing recommendation was the following: Unasyn 3 g IV every 6 hours, clindamycin 600 IV every 8 hours, and Decadron 10 mg IV every 8 hours.  First dose of each will be given in the emergency department.  He recommended that if the patient is improving after 24 hours on this regimen that he can be discharged with clindamycin 300 mg PO 4 times daily for 14 days and a 2-week prednisone taper, as well as outpatient ENT follow-up.  Dr. Jenne Campus suggested that ENT consult is not necessary if the patient is stable and improving after 24 hours; they should be consulted if he is worsening or if he is not improved after 24 hours.  I discussed the case by phone with Dr. Tobi Bastos with the hospitalist service who will admit.  [CF]    Clinical Course User Index [CF] Loleta Rose, MD    ____________________________________________  FINAL CLINICAL IMPRESSION(S) / ED DIAGNOSES  Final  diagnoses:  Suppurative lymphadenitis  Localized swelling, mass or lump of neck     MEDICATIONS GIVEN DURING THIS VISIT:  Medications  Ampicillin-Sulbactam (UNASYN) 3 g in sodium chloride 0.9 % 100 mL IVPB (not administered)  dexamethasone (DECADRON) injection 10 mg (not administered)  clindamycin (CLEOCIN) IVPB 600 mg (not administered)  dexamethasone (DECADRON) injection 10 mg (not administered)  clindamycin (CLEOCIN) IVPB 600 mg (not administered)  iopamidol (ISOVUE-300) 61 % injection 75 mL (75 mLs Intravenous Contrast Given 05/16/17 0302)     ED Discharge Orders    None       Note:  This document was prepared using Dragon voice  recognition software and may include unintentional dictation errors.    Loleta Rose, MD 05/16/17 [phone]

## 2017-05-16 NOTE — ED Notes (Signed)
Patient transported to CT 

## 2017-05-16 NOTE — Progress Notes (Signed)
Pharmacy Antibiotic Note  Anthony BucklerRalph E Angst Jr. is a 47 y.o. male admitted on 05/16/2017 with lymphadenitis.  Pharmacy has been consulted for Unasyn dosing.  Plan: Unasyn 3 grams q 6 hours ordered.  Height: 5\' 9"  (175.3 cm) Weight: 165 lb (74.8 kg) IBW/kg (Calculated) : 70.7  Temp (24hrs), Avg:99.6 F (37.6 C), Min:99.6 F (37.6 C), Max:99.6 F (37.6 C)  Recent Labs  Lab 05/15/17 2316  WBC 12.6*  CREATININE 0.89    Estimated Creatinine Clearance: 104.8 mL/min (by C-G formula based on SCr of 0.89 mg/dL).    Allergies  Allergen Reactions  . Chantix [Varenicline] Other (See Comments)    Bad dreams, anxiety    Antimicrobials this admission: Unasyn, clindamycin 1/23  >>    >>   Dose adjustments this admission:   Microbiology results: No micro  Thank you for allowing pharmacy to be a part of this patient's care.  Timoteo Carreiro S 05/16/2017 6:17 AM

## 2017-05-16 NOTE — Plan of Care (Signed)
  Progressing Clinical Measurements: Ability to maintain clinical measurements within normal limits will improve 05/16/2017 0643 - Progressing by Girtha HakeFuentes, Lee Kuang F, RN Activity: Risk for activity intolerance will decrease 05/16/2017 0643 - Progressing by Girtha HakeFuentes, Biruk Troia F, RN Nutrition: Adequate nutrition will be maintained 05/16/2017 [id number] - Progressing by Girtha HakeFuentes, Odaly Peri F, RN Pain Managment: General experience of comfort will improve 05/16/2017 [phone] - Progressing by Girtha HakeFuentes, Gennavieve Huq F, RN Safety: Ability to remain free from injury will improve 05/16/2017 0643 - Progressing by Girtha HakeFuentes, Kristle Wesch F, RN

## 2017-05-17 ENCOUNTER — Ambulatory Visit: Payer: Commercial Managed Care - PPO | Admitting: Family Medicine

## 2017-05-17 LAB — HIV ANTIBODY (ROUTINE TESTING W REFLEX): HIV Screen 4th Generation wRfx: NONREACTIVE

## 2017-05-17 MED ORDER — CLINDAMYCIN HCL 300 MG PO CAPS: 300.0000 mg | ORAL_CAPSULE | Freq: Three times a day (TID) | ORAL | 0 refills | Status: AC

## 2017-05-17 MED ORDER — PREDNISONE 10 MG (21) PO TBPK: ORAL_TABLET | ORAL | 0 refills | Status: DC

## 2017-05-17 NOTE — Plan of Care (Signed)
VSS, free of falls during shift.  Denies pain.  L neck swelling improved per pt.  No needs overnight.  Bed in low position, call bell within reach.  WCTM.

## 2017-05-17 NOTE — Discharge Instructions (Signed)
Lymphadenopathy Lymphadenopathy refers to swollen or enlarged lymph glands, also called lymph nodes. Lymph glands are part of your body's defense (immune) system, which protects the body from infections, germs, and diseases. Lymph glands are found in many locations in your body, including the neck, underarm, and groin. Many things can cause lymph glands to become enlarged. When your immune system responds to germs, such as viruses or bacteria, infection-fighting cells and fluid build up. This causes the glands to grow in size. Usually, this is not something to worry about. The swelling and any soreness often go away without treatment. However, swollen lymph glands can also be caused by a number of diseases. Your health care provider may do various tests to help determine the cause. If the cause of your swollen lymph glands cannot be found, it is important to monitor your condition to make sure the swelling goes away. Follow these instructions at home: Watch your condition for any changes. The following actions may help to lessen any discomfort you are feeling:  Get plenty of rest.  Take medicines only as directed by your health care provider. Your health care provider may recommend over-the-counter medicines for pain.  Apply moist heat compresses to the site of swollen lymph nodes as directed by your health care provider. This can help reduce any pain.  Check your lymph nodes daily for any changes.  Keep all follow-up visits as directed by your health care provider. This is important.  Contact a health care provider if:  Your lymph nodes are still swollen after 2 weeks.  Your swelling increases or spreads to other areas.  Your lymph nodes are hard, seem fixed to the skin, or are growing rapidly.  Your skin over the lymph nodes is red and inflamed.  You have a fever.  You have chills.  You have fatigue.  You develop a sore throat.  You have abdominal pain.  You have weight  loss.  You have night sweats. Get help right away if:  You notice fluid leaking from the area of the enlarged lymph node.  You have severe pain in any area of your body.  You have chest pain.  You have shortness of breath. This information is not intended to replace advice given to you by your health care provider. Make sure you discuss any questions you have with your health care provider. Document Released: 01/18/2008 Document Revised: 09/16/2015 Document Reviewed: 11/13/2013 Elsevier Interactive Patient Education  2018 [employer] Inc[employer]  

## 2017-05-17 NOTE — Progress Notes (Signed)
Discharge instructions given and went over with patient at bedside. Prescriptions and follow-up appointments reviewed. All questions answered. Patient discharged home. Bo McclintockBrewer,Travia Onstad S, RN

## 2017-05-20 NOTE — Discharge Summary (Signed)
[facility] [place] [facility]   PATIENT NAME: Anthony Cardenas    MR#:  [id number]  DATE OF BIRTH:  [birth year]  DATE OF ADMISSION:  05/16/2017   ADMITTING PHYSICIAN: Anthony Austin, MD  DATE OF DISCHARGE: 05/17/2017 10:04 AM  PRIMARY CARE PHYSICIAN: Anthony Passey, MD   ADMISSION DIAGNOSIS:  Localized swelling, mass or lump of neck [R22.1] Suppurative lymphadenitis [L04.9] DISCHARGE DIAGNOSIS:  Active Problems:   Lymphadenitis  SECONDARY DIAGNOSIS:   Past Medical History:  Diagnosis Date  . Dermatitis   . Hypertension   . Migraines   . Vitamin D deficiency disease    9.1 in Feb 2016   HOSPITAL COURSE:  [name] for swelling in the left side of the neck which is been going on for the last 1-1/2-week.  * Bacterial cervical lymphadenitis.  The report of a "necrotic" lymph node most likely represents minor abscess formation.  - Patient had swelling in both the right and left side of the neck initially after reporting upper respiratory symptoms of congestion and postnasal drainage, with some initial sore throat and low-grade fevers.The swelling of the right side of the neck has resolved,  - Good improvement on clindamycin antibiotic and IV Decadron since admission.  He already notes significant improvement in the swelling, discomfort, and his swallowing has improved.   - Given his rapid improvement on IV antibiotics and steroids ENT didn't recommend any further work up - being D/Ced on oral antibiotics and steroids, outpatient follow up with ENT DISCHARGE CONDITIONS:  STABLE CONSULTS OBTAINED:  Treatment Team:  Geanie Logan, MD DRUG ALLERGIES:   Allergies  Allergen Reactions  . Chantix [Varenicline] Other (See Comments)    Bad dreams, anxiety   DISCHARGE MEDICATIONS:   Allergies as of 05/17/2017      Reactions   Chantix [varenicline] Other (See Comments)   Bad dreams, anxiety      Medication List      TAKE these medications   amLODipine 10 MG tablet Commonly known as:  NORVASC Take 1 tablet (10 mg total) by mouth daily.   chlorthalidone 50 MG tablet Commonly known as:  HYGROTON Take 1 tablet (50 mg total) by mouth daily.   cholecalciferol 1000 units tablet Commonly known as:  VITAMIN D Take 1,000 Units by mouth daily.   clindamycin 300 MG capsule Commonly known as:  CLEOCIN Take 1 capsule (300 mg total) by mouth 3 (three) times daily for 10 days.   losartan 50 MG tablet Commonly known as:  COZAAR Take 1 tablet (50 mg total) by mouth daily.   omeprazole 20 MG capsule Commonly known as:  PRILOSEC Take 1 capsule (20 mg total) by mouth daily as needed.   predniSONE 10 MG (21) Tbpk tablet Commonly known as:  STERAPRED UNI-PAK 21 TAB Start 60 mg po daily, taper 10 mg daily until done   propranolol ER 80 MG 24 hr capsule Commonly known as:  INDERAL LA Take 1 capsule (80 mg total) by mouth daily.        DISCHARGE INSTRUCTIONS:   DIET:  Regular diet DISCHARGE CONDITION:  Good ACTIVITY:  Activity as tolerated OXYGEN:  Home Oxygen: No.  Oxygen Delivery: room air DISCHARGE LOCATION:  home   If you experience worsening of your admission symptoms, develop shortness of breath, life threatening emergency, suicidal or homicidal thoughts you must seek medical attention immediately by calling 911 or calling your MD immediately  if symptoms less severe.  You Must read complete  instructions/literature along with all the possible adverse reactions/side effects for all the Medicines you take and that have been prescribed to you. Take any new Medicines after you have completely understood and accpet all the possible adverse reactions/side effects.   Please note  You were cared for by a hospitalist during your hospital stay. If you have any questions about your discharge medications or the care you received while you were in the hospital after you are discharged, you can call the  unit and asked to speak with the hospitalist on call if the hospitalist that took care of you is not available. Once you are discharged, your primary care physician will handle any further medical issues. Please note that NO REFILLS for any discharge medications will be authorized once you are discharged, as it is imperative that you return to your primary care physician (or establish a relationship with a primary care physician if you do not have one) for your aftercare needs so that they can reassess your need for medications and monitor your lab values.    On the day of Discharge:  VITAL SIGNS:  Blood pressure (!) 143/86, pulse 71, temperature 97.7 F (36.5 C), temperature source Oral, resp. rate 20, height 5\' 9"  (1.753 m), weight 74.8 kg (165 lb), SpO2 95 %. PHYSICAL EXAMINATION:  GENERAL:  47 y.o.-year-old patient lying in the bed with no acute distress.  EYES: Pupils equal, round, reactive to light and accommodation. No scleral icterus. Extraocular muscles intact.  HEENT: Head atraumatic, normocephalic. Oropharynx and nasopharynx clear.  NECK:  Supple, no jugular venous distention. No thyroid enlargement, no tenderness.  LUNGS: Normal breath sounds bilaterally, no wheezing, rales,rhonchi or crepitation. No use of accessory muscles of respiration.  CARDIOVASCULAR: S1, S2 normal. No murmurs, rubs, or gallops.  ABDOMEN: Soft, non-tender, non-distended. Bowel sounds present. No organomegaly or mass.  EXTREMITIES: No pedal edema, cyanosis, or clubbing.  NEUROLOGIC: Cranial nerves II through XII are intact. Muscle strength 5/5 in all extremities. Sensation intact. Gait not checked.  PSYCHIATRIC: The patient is alert and oriented x 3.  SKIN: No obvious rash, lesion, or ulcer.  DATA REVIEW:   CBC Recent Labs  Lab 05/16/17 0635  WBC 12.5*  HGB 16.7  HCT 49.3  PLT 217    Chemistries  Recent Labs  Lab 05/15/17 2316 05/16/17 0635  NA 137 136  K 4.0 3.8  CL 101 99*  CO2 28 28    GLUCOSE 140* 119*  BUN 7 6  CREATININE 0.89 0.87  CALCIUM 9.3 9.6  AST 19  --   ALT 16*  --   ALKPHOS 67  --   BILITOT 0.7  --      Follow-up Information    Anthony Cardenas, Anthony P, MD. Go on 05/25/2017.   Specialty:  Family Medicine Why:  at 11:00 a.m. Contact information: [address] [place] [address] [phone]        Geanie LoganBennett, Paul, MD. Go on 05/31/2017.   Specialty:  Otolaryngology Why:  at 9:45 a.m. Contact information: [address] [place] [id number] [phone]           Management plans discussed with the patient, family and they are in agreement.  CODE STATUS: Prior   TOTAL TIME TAKING CARE OF THIS PATIENT: 45 minutes.    Delfino LovettVipul Sereen Schaff M.D on 05/20/2017 at 9:58 PM  Between 7am to 6pm - Pager - [phone]  After 6pm go to [email or web address] - Therapist, nutritionalpassword EPAS [facility]  [facility] [place]  [facility]  Office  [phone]  CC: Primary care physician; Anthony Passey, MD   Note: This dictation was prepared with Dragon dictation along with smaller phrase technology. Any transcriptional errors that result from this process are unintentional.

## 2017-05-25 ENCOUNTER — Inpatient Hospital Stay: Payer: Commercial Managed Care - PPO | Admitting: Family Medicine

## 2017-05-28 ENCOUNTER — Telehealth: Payer: Self-pay | Admitting: Family Medicine

## 2017-05-28 NOTE — Telephone Encounter (Signed)
Copied from CRM [phone]#[id number]. Topic: Quick Communication - See Telephone Encounter >> May 28, 2017 10:19 AM Landry MellowFoltz, Melissa J wrote: CRM for notification. See Telephone encounter for:   05/28/17. Pt had infection in lymph nodes throat - that was the reason for his hospitalization.  He has finished abx and steroid. But still feels some swelling in neck area. His neck is still tender and hurts when he turns his neck. He feels pressure when bending over  Pt is asking for refill of more abx until he can have his follow up on 06/05/17. Pt is not having sob. [facility]  [phone]

## 2017-05-28 NOTE — Telephone Encounter (Signed)
Called patient back twice with voicemail not setup.  We have not seen pt since 2017 and we have not evaluated pt for his sickness.  He will need appt.  And can mostly likely get him in as soon as today if not with Dr. She[facility]Lada, [facility].

## 2017-05-29 ENCOUNTER — Ambulatory Visit (INDEPENDENT_AMBULATORY_CARE_PROVIDER_SITE_OTHER): Payer: Commercial Managed Care - PPO | Admitting: Family Medicine

## 2017-05-29 ENCOUNTER — Encounter: Payer: Self-pay | Admitting: Family Medicine

## 2017-05-29 VITALS — BP 130/84 | HR 84 | Temp 98.4°F | Resp 18 | Ht 69.0 in | Wt 169.5 lb

## 2017-05-29 DIAGNOSIS — R911 Solitary pulmonary nodule: Secondary | ICD-10-CM | POA: Diagnosis not present

## 2017-05-29 DIAGNOSIS — F419 Anxiety disorder, unspecified: Secondary | ICD-10-CM

## 2017-05-29 DIAGNOSIS — I10 Essential (primary) hypertension: Secondary | ICD-10-CM

## 2017-05-29 DIAGNOSIS — I889 Nonspecific lymphadenitis, unspecified: Secondary | ICD-10-CM | POA: Diagnosis not present

## 2017-05-29 DIAGNOSIS — Z720 Tobacco use: Secondary | ICD-10-CM

## 2017-05-29 DIAGNOSIS — J439 Emphysema, unspecified: Secondary | ICD-10-CM | POA: Diagnosis not present

## 2017-05-29 MED ORDER — AMLODIPINE BESYLATE 10 MG PO TABS: 10.0000 mg | ORAL_TABLET | Freq: Every day | ORAL | 1 refills | Status: DC

## 2017-05-29 MED ORDER — PROPRANOLOL HCL ER 80 MG PO CP24: 80.0000 mg | ORAL_CAPSULE | Freq: Every day | ORAL | 1 refills | Status: DC

## 2017-05-29 NOTE — Patient Instructions (Signed)
Keep ENT appointment Follow up with Dr. Sherie DonLada in 2-3 months

## 2017-05-29 NOTE — Progress Notes (Signed)
Name: Anthony Cardenas.   MRN: [id number]    DOB: 10/07/1970   Date:05/29/2017       Progress Note  Subjective  Chief Complaint  Chief Complaint  Patient presents with  . Hospitalization Follow-up    infection of lymph node, still have lump left side of neck, tender    HPI  Hospital Follow Up Pt presents for hospital follow up after being admitted 05/16/2017 -05/17/2017 for suppurative lymphadenitis - LEFT sided only, was seen by ENT in ER and had intranasal scope performed along with CT soft tissue of the neck.  Was given PO Clindamycin and Prednisone (6 day taper) - finished Clindamycin yesterday, and finished prednisone a few days ago.  LEFT nodes are significantly improved, but still slightly swollen and mildly tender on palpation.  Has follow up with [facility] scheduled for next week on 06/04/2017.  Denies fevers, chills, occasional headache, some limited ROM of the neck due to residual swelling, no difficulty swallowing or breathing.    CT Scan of soft tissue of the neck:  1. Suppurative adenitis with associated cellulitis of the LEFT neck. 2. Emphysema 3. 4mm RUL Lung Nodule - recommend repeat in 12 months if high risk (pt is 1ppd smoker) [Electronically Signed   By: Rise Mu M.D.   On: 05/16/2017 04:04]  Tobacco Use: Still smoking about 1ppd, but is thinking about quitting after his recent hospitalization.  Declines medication - did not like Chantix or Wellbutrin in the past.  CT scan found emphysema and RUL lung nodule as above.  Message sent to PCP, Dr. Sherie Don, to follow up w/ 12 month CT scan.  PT denies chest pain or shortness of breath, wheezing; does have a daily morning cough at present.  HTN: Taking medications as prescribed - current regimen includes propanolol, amlodipine for BP control at this time.  Denies chest pain, shortness of breath, BLE edema, lightheadedness, dizziness or palpitations.  DASH diet discussed - pt does try, but works at [employer]  and slips up many days because this food is what is readily available; when he goes grocery shopping he does try to purchase healthier options like fresh vegetables. Pt does not check BP at home..  The following are contributing factors: stress, recent corticosteroid use, saturated fats in diet, , smoking  Anxiety: Anxiety has been under good control; also has a slight tremor in bilateral hands.  He notes mood has been very good, propanolol helps with tremor and anxiety.  He also notes that after he came off of Chantix his anxiety has been much better.  We will refill propanolol today.   Patient Active Problem List   Diagnosis Date Noted  . Lymphadenitis 05/16/2017  . Chronic pain of right ankle 03/14/2016  . Anxiety 09/12/2015  . Medication monitoring encounter 05/18/2015  . Tobacco abuse 04/11/2015  . Hypertension   . Vitamin D deficiency disease   . Dermatitis     Social History   Tobacco Use  . Smoking status: Current Every Day Smoker    Packs/day: 1.00    Types: Cigarettes  . Smokeless tobacco: Never Used  Substance Use Topics  . Alcohol use: Yes    Comment: occasionally     Current Outpatient Medications:  .  amLODipine (NORVASC) 10 MG tablet, Take 1 tablet (10 mg total) by mouth daily., Disp: 30 tablet, Rfl: 11 .  cholecalciferol (VITAMIN D) 1000 units tablet, Take 1,000 Units by mouth daily., Disp: , Rfl:  .  omeprazole (PRILOSEC)  20 MG capsule, Take 1 capsule (20 mg total) by mouth daily as needed., Disp: 14 capsule, Rfl: 1 .  propranolol ER (INDERAL LA) 80 MG 24 hr capsule, Take 1 capsule (80 mg total) by mouth daily., Disp: 30 capsule, Rfl: 11 .  chlorthalidone (HYGROTON) 50 MG tablet, Take 1 tablet (50 mg total) by mouth daily. (Patient not taking: Reported on 05/16/2017), Disp: 30 tablet, Rfl: 2 .  losartan (COZAAR) 50 MG tablet, Take 1 tablet (50 mg total) by mouth daily. (Patient not taking: Reported on 05/16/2017), Disp: 30 tablet, Rfl: 2 .  predniSONE (STERAPRED  UNI-PAK 21 TAB) 10 MG (21) TBPK tablet, Start 60 mg po daily, taper 10 mg daily until done (Patient not taking: Reported on 05/29/2017), Disp: 21 tablet, Rfl: 0  Allergies  Allergen Reactions  . Chantix [Varenicline] Other (See Comments)    Bad dreams, anxiety    ROS  Constitutional: Negative for fever or weight change.  Respiratory: Negative for cough and shortness of breath.   Cardiovascular: Negative for chest pain or palpitations.  Gastrointestinal: Negative for abdominal pain, no bowel changes.  Musculoskeletal: Negative for gait problem or joint swelling.  Skin: Negative for rash.  Neurological: Negative for dizziness or headache.  No other specific complaints in a complete review of systems (except as listed in HPI above).  Objective  Vitals:   05/29/17 0822  BP: 130/84  Pulse: 84  Resp: 18  Temp: 98.4 F (36.9 C)  TempSrc: Oral  SpO2: 95%  Weight: 169 lb 8 oz (76.9 kg)  Height: 5\' 9"  (1.753 m)    Body mass index is 25.03 kg/m.  Nursing Note and Vital Signs reviewed.  Physical Exam  Constitutional: Patient appears well-developed and well-nourished. No distress.  HEENT: head atraumatic, normocephalic, pupils equal and reactive to light, EOM's intact, neck with LEFT sided submandibular firm, enlarged, mildly tender lymph node with no erythema, heat or fluctuance, oropharynx pink and moist without exudate Cardiovascular: Normal rate, regular rhythm, S1/S2 present.  No murmur or rub heard. No BLE edema. Pulmonary/Chest: Effort normal and breath sounds clear. No respiratory distress or retractions. Psychiatric: Patient has a normal mood and affect. behavior is normal. Judgment and thought content normal.  No results found for this or any previous visit (from the past 72 hour(s)).  Assessment & Plan  1. Lymphadenitis - Strict return precautions discussed. - Keep follow up with ENT.  2. Essential hypertension - amLODipine (NORVASC) 10 MG tablet; Take 1 tablet (10  mg total) by mouth daily.  Dispense: 90 tablet; Refill: 1 - propranolol ER (INDERAL LA) 80 MG 24 hr capsule; Take 1 capsule (80 mg total) by mouth daily.  Dispense: 90 capsule; Refill: 1  3. Anxiety - propranolol ER (INDERAL LA) 80 MG 24 hr capsule; Take 1 capsule (80 mg total) by mouth daily.  Dispense: 90 capsule; Refill: 1  4. Tobacco abuse - Cessation discussed in detail  5. Pulmonary emphysema, unspecified emphysema type (HCC) - Smoking cessation advised, no medication changes at this time.  6. Pulmonary nodule, right - Smoking cessation advised, no medication changes at this time. - Note to PCP Dr. Sherie DonLada sent to ensure 12 mo follow up CT scan.  -Red flags and when to present for emergency care or RTC including fever >101.28F, chest pain, shortness of breath, new/worsening/un-resolving symptoms, lymph node with increased swelling, redness or heat. reviewed with patient at time of visit. Follow up and care instructions discussed and provided in AVS.

## 2017-06-05 ENCOUNTER — Inpatient Hospital Stay: Payer: Commercial Managed Care - PPO | Admitting: Family Medicine

## 2017-06-07 ENCOUNTER — Other Ambulatory Visit: Payer: Self-pay | Admitting: Otolaryngology

## 2017-06-07 DIAGNOSIS — R221 Localized swelling, mass and lump, neck: Secondary | ICD-10-CM

## 2017-06-12 ENCOUNTER — Other Ambulatory Visit: Payer: Self-pay | Admitting: General Surgery

## 2017-06-12 ENCOUNTER — Ambulatory Visit
Admission: RE | Admit: 2017-06-12 | Discharge: 2017-06-12 | Disposition: A | Discharge status: Home / Self Care | Payer: Commercial Managed Care - PPO | Source: Ambulatory Visit | Attending: Otolaryngology | Admitting: Otolaryngology

## 2017-06-12 DIAGNOSIS — R221 Localized swelling, mass and lump, neck: Secondary | ICD-10-CM

## 2017-06-12 DIAGNOSIS — E559 Vitamin D deficiency, unspecified: Secondary | ICD-10-CM | POA: Diagnosis not present

## 2017-06-12 DIAGNOSIS — I10 Essential (primary) hypertension: Secondary | ICD-10-CM | POA: Insufficient documentation

## 2017-06-12 DIAGNOSIS — F17210 Nicotine dependence, cigarettes, uncomplicated: Secondary | ICD-10-CM | POA: Insufficient documentation

## 2017-06-12 LAB — CBC
HCT: 45.2 % (ref 40.0–52.0)
Hemoglobin: 15.4 g/dL (ref 13.0–18.0)
MCH: 33.5 pg (ref 26.0–34.0)
MCHC: 34.1 g/dL (ref 32.0–36.0)
MCV: 98.2 fL (ref 80.0–100.0)
PLATELETS: 177 10*3/uL (ref 150–440)
RBC: 4.6 MIL/uL (ref 4.40–5.90)
RDW: 12.8 % (ref 11.5–14.5)
WBC: 7.2 10*3/uL (ref 3.8–10.6)

## 2017-06-12 LAB — PROTIME-INR
INR: 1.03
PROTHROMBIN TIME: 13.4 s (ref 11.4–15.2)

## 2017-06-12 MED ORDER — MIDAZOLAM HCL 5 MG/5ML IJ SOLN
INTRAMUSCULAR | Status: AC
  Filled 2017-06-12: qty 5

## 2017-06-12 MED ORDER — FENTANYL CITRATE (PF) 100 MCG/2ML IJ SOLN
INTRAMUSCULAR | Status: AC | PRN
  Administered 2017-06-12: 50 ug via INTRAVENOUS
  Administered 2017-06-12: 25 ug via INTRAVENOUS

## 2017-06-12 MED ORDER — SODIUM CHLORIDE 0.9 % IV SOLN
INTRAVENOUS | Status: DC
  Administered 2017-06-12: 14:00:00 via INTRAVENOUS

## 2017-06-12 MED ORDER — MIDAZOLAM HCL 5 MG/5ML IJ SOLN
INTRAMUSCULAR | Status: AC | PRN
  Administered 2017-06-12 (×2): 1 mg via INTRAVENOUS

## 2017-06-12 MED ORDER — FENTANYL CITRATE (PF) 100 MCG/2ML IJ SOLN
INTRAMUSCULAR | Status: AC
  Filled 2017-06-12: qty 2

## 2017-06-12 NOTE — Progress Notes (Signed)
Dr. Lowella DandyHenn in at bedside to speak with pt. & his father re: procedure. Pt. A&O x3 without any c/o . Both verbalized understanding of discussion.

## 2017-06-12 NOTE — Consult Note (Signed)
Chief Complaint: Patient was seen in consultation today for left neck lymph node/mass biopsy at the request of Bennett,Paul  Referring Physician(s): Bennett,Paul  Patient Status: [facility] - Out-pt  History of Present Illness: Anthony BucklerRalph E Dingus Jr. is a 47 y.o. male with history of severe swelling in the left upper neck. Patient had a CT scan of the neck that suggested suppurative adenitis and cellulitis. The patient received antibiotics and a steroid taper. The soft tissue swelling in the neck has markedly decreased but the patient continues to have soft tissue fullness in this area. Request for ultrasound-guided biopsy. Patient has no complaints today. He denies fevers or chills. No chest pain or respiratory symptoms.  Past Medical History:  Diagnosis Date  . Dermatitis   . Hypertension   . Migraines   . Vitamin D deficiency disease    9.1 in Feb 2016    Past Surgical History:  Procedure Laterality Date  . DENTAL SURGERY  2011    Allergies: Chantix [varenicline]  Medications: Prior to Admission medications   Medication Sig Start Date End Date Taking? Authorizing Provider  amLODipine (NORVASC) 10 MG tablet Take 1 tablet (10 mg total) by mouth daily. 05/29/17  Yes Doren CustardBoyce, Emily E, FNP  cholecalciferol (VITAMIN D) 1000 units tablet Take 1,000 Units by mouth daily.   Yes [provider]  omeprazole (PRILOSEC) 20 MG capsule Take 1 capsule (20 mg total) by mouth daily as needed. 06/09/15  Yes Lada, Janit BernMelinda P, MD  propranolol ER (INDERAL LA) 80 MG 24 hr capsule Take 1 capsule (80 mg total) by mouth daily. 05/29/17  Yes Doren CustardBoyce, Emily E, FNP     Family History  Problem Relation Age of Onset  . Cancer Mother        lung  . COPD Mother   . Asthma Mother   . Hypertension Mother   . Cancer Paternal Grandfather        lung  . Heart disease Maternal Grandmother   . Hypertension Maternal Grandmother   . Stroke Maternal Grandmother   . Diabetes Neg Hx     Social History    Socioeconomic History  . Marital status: Married    Spouse name: None  . Number of children: None  . Years of education: None  . Highest education level: None  Social Needs  . Financial resource strain: None  . Food insecurity - worry: None  . Food insecurity - inability: None  . Transportation needs - medical: None  . Transportation needs - non-medical: None  Occupational History    Employer: [employer]  Tobacco Use  . Smoking status: Current Every Day Smoker    Packs/day: 1.00    Types: Cigarettes  . Smokeless tobacco: Never Used  Substance and Sexual Activity  . Alcohol use: Yes    Comment: occasionally  . Drug use: No  . Sexual activity: Yes  Other Topics Concern  . None  Social History Narrative  . None    Review of Systems: A 12 point ROS discussed and pertinent positives are indicated in the HPI above.  All other systems are negative.  Review of Systems  Constitutional: Negative for chills and fever.  Respiratory: Negative.   Cardiovascular: Negative for chest pain.  Gastrointestinal: Negative.   Genitourinary: Negative.     Vital Signs: BP (!) 152/98   Pulse 84   Temp 98.9 F (37.2 C) (Oral)   Resp 20   Ht 5\' 9"  (1.753 m)   Wt 163 lb (73.9 kg)  SpO2 95%   BMI 24.07 kg/m   Physical Exam  Constitutional: No distress.  HENT:  Mouth/Throat: Oropharynx is clear and moist.  Cardiovascular: Normal rate, regular rhythm and normal heart sounds.  Pulmonary/Chest: Effort normal and breath sounds normal.  Abdominal: Soft. He exhibits no distension. There is no tenderness.    Imaging: Ct Soft Tissue Neck W Contrast  Result Date: 05/16/2017 CLINICAL DATA:  Initial evaluation for acute left-sided neck swelling with pain, possible fever at home. EXAM: CT NECK WITH CONTRAST TECHNIQUE: Multidetector CT imaging of the neck was performed using the standard protocol following the bolus administration of intravenous contrast. CONTRAST:  75mL ISOVUE-300  IOPAMIDOL (ISOVUE-300) INJECTION 61% COMPARISON:  None available. FINDINGS: Pharynx and larynx: Oral cavity within normal limits without mass lesion or loculated fluid collection. Patient is edentulous. Palatine tonsils symmetric and within normal limits. Few scattered calcified tonsilliths noted. Nasopharynx within normal limits. No retropharyngeal collection or effusion. Epiglottis normal. Vallecula clear. Inflammatory stranding within the left parapharyngeal fat related to the inflammatory process within the left neck without significant mucosal edema at this time. Supraglottic airway remains patent. Piriform sinuses are clear. True cords symmetric and normal. Subglottic airway clear. Salivary glands: Inflammatory stranding surrounds the left parotid gland related to the inflammatory process within the left neck. The gland itself is felt to be within normal limits without evidence for acute parotitis. Right parotid gland normal. 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 Similarly, inflammatory stranding surrounds the left submandibular gland as well which is also felt to be within normal limits itself. Right submandibular gland normal. Thyroid: Thyroid normal. Lymph nodes: Multiple prominent left level II/III lymph nodes are seen within this region. Most prominent of these seen at level II a and measures 2.7 x 1.5 cm (series 2, image 49). Irregular hypodensity within this node suggestive of necrosis. Extensive associated asymmetric soft tissue swelling with inflammatory stranding within the adjacent left lateral and anterior neck. Asymmetric thickening and irregularity of the left platysmas. Inflammatory changes extend to abut the inferior aspect of the left parotid gland, with extension into the left submandibular space to surround the left submandibular gland as well. Edema and phlegmon extend along the left  sternocleidomastoid muscle without frank abscess. Inflammatory stranding extends into the left parapharyngeal space with involvement of the left parapharyngeal fat. Findings are of uncertain etiology, but favored to reflect sequelae of suppurative adenitis with associated cellulitis. No enlarged right-sided or supraclavicular adenopathy. Vascular: Normal intravascular enhancement seen throughout the neck. Left internal jugular vein compressed at the level of the left level 2 adenopathy but remains patent. Limited intracranial: Unremarkable. Visualized orbits: Visualized globes and orbital soft tissues are within normal limits. Mastoids and visualized paranasal sinuses: Visualized paranasal sinuses and mastoid air cells are clear. Middle ear cavities are well pneumatized and clear. Skeleton: No acute osseous abnormality. No worrisome lytic or blastic osseous lesions. Upper chest: Visualized upper mediastinum and chest within normal limits. Mild emphysematous changes noted within the partially visualized lungs. 4 mm right upper lobe nodule noted (series 5, image 42), indeterminate. Other: None. IMPRESSION: 1. Prominent and enlarged left level II/III adenopathy (one of which is necrotic in appearance) with associated inflammatory changes and swelling within the adjacent left neck. Given the history of fever, an infectious suppurative adenitis with associated cellulitis is favored. Clinical follow-up to resolution is recommended as an underlying malignancy or nodal metastases are not entirely  excluded. 2. Emphysema. 3. 4 mm right upper lobe pulmonary nodule, indeterminate. No follow-up needed if patient is low-risk. Non-contrast chest CT can be considered in 12 months if patient is high-risk. This recommendation follows the consensus statement: Guidelines for Management of Incidental Pulmonary Nodules Detected on CT Images: From the Fleischner Society 2017; Radiology 2017; 284:228-243. Electronically Signed   By:  Rise Mu M.D.   On: 05/16/2017 04:04    Labs:  CBC: Recent Labs    05/15/17 2316 05/16/17 0635 06/12/17 1332  WBC 12.6* 12.5* 7.2  HGB 16.0 16.7 15.4  HCT 46.5 49.3 45.2  PLT 210 217 177    COAGS: Recent Labs    06/12/17 1332  INR 1.03    BMP: Recent Labs    05/15/17 2316 05/16/17 0635  NA 137 136  K 4.0 3.8  CL 101 99*  CO2 28 28  GLUCOSE 140* 119*  BUN 7 6  CALCIUM 9.3 9.6  CREATININE 0.89 0.87  GFRNONAA >60 >60  GFRAA >60 >60    LIVER FUNCTION TESTS: Recent Labs    05/15/17 2316  BILITOT 0.7  AST 19  ALT 16*  ALKPHOS 67  PROT 7.7  ALBUMIN 4.2    TUMOR MARKERS: No results for input(s): AFPTM, CEA, CA199, CHROMGRNA in the last 8760 hours.  Assessment and Plan:  46 year old with a history of left neck swelling that has improved but not resolved with antibiotics and steroids. Request for ultrasound-guided biopsy of this area. Ultrasound-guided biopsy was discussed with the patient. The risks including bleeding and infection were discussed. We also discussed the possibility of using pain medication or moderate sedation if needed. Informed consent was obtained from the patient. Plan for ultrasound-guided biopsy of the mass or lymphadenopathy in the left upper neck.  Thank you for this interesting consult.  I greatly enjoyed meeting Anthony Cardenas. and look forward to participating in their care.  A copy of this report was sent to the requesting provider on this date.  Electronically Signed: Arn Medal, MD 06/12/2017, 2:11 PM   I spent a total of  15 Minutes   in face to face in clinical consultation, greater than 50% of which was counseling/coordinating care for left neck swelling.

## 2017-06-12 NOTE — Procedures (Signed)
US guided FNA (8 passes) and 1 core of left cervical lymph node.  The core biopsy was technically difficult and no tissue obtained.  Therefore, only FNAs were collected.  The most "abnormal" lymph in left upper neck was sampled.  Minimal blood loss and no immediate complication.

## 2017-06-12 NOTE — Sedation Documentation (Signed)
Total sedation: Versed 3 mg IV, Fentanyl 75 mcg IV. Total sedation time 31 min.

## 2017-06-13 LAB — SURGICAL PATHOLOGY

## 2017-06-13 LAB — CYTOLOGY - NON PAP

## 2017-09-04 ENCOUNTER — Ambulatory Visit: Payer: Commercial Managed Care - PPO | Admitting: Family Medicine

## 2017-12-18 ENCOUNTER — Other Ambulatory Visit: Payer: Self-pay | Admitting: Family Medicine

## 2017-12-18 DIAGNOSIS — F419 Anxiety disorder, unspecified: Secondary | ICD-10-CM

## 2017-12-18 DIAGNOSIS — I10 Essential (primary) hypertension: Secondary | ICD-10-CM

## 2017-12-18 MED ORDER — PROPRANOLOL HCL ER 80 MG PO CP24: 80.0000 mg | ORAL_CAPSULE | Freq: Every day | ORAL | 0 refills | Status: DC

## 2017-12-18 MED ORDER — AMLODIPINE BESYLATE 10 MG PO TABS
10.0000 mg | ORAL_TABLET | Freq: Every day | ORAL | 0 refills | Status: DC
Start: 2017-12-18 — End: 2018-01-15

## 2017-12-18 NOTE — Telephone Encounter (Signed)
Last 2/5 and scheduled for 9/24

## 2017-12-18 NOTE — Telephone Encounter (Signed)
Copied from CRM [id number]. Topic: Quick Communication - See Telephone Encounter >> Dec 18, 2017 12:51 PM Terisa Starraylor, Brittany L wrote: CRM for notification. See Telephone encounter for: 12/18/17.  Patient would like to know could he have a refill on his propranolol ER (INDERAL LA) 80 MG 24 hr capsule and his amLODipine (NORVASC) 10 MG tablet until his appt on 9/24.   [facility]

## 2018-01-15 ENCOUNTER — Ambulatory Visit: Payer: Commercial Managed Care - PPO | Admitting: Family Medicine

## 2018-01-15 ENCOUNTER — Encounter: Payer: Self-pay | Admitting: Family Medicine

## 2018-01-15 DIAGNOSIS — Z720 Tobacco use: Secondary | ICD-10-CM

## 2018-01-15 DIAGNOSIS — I10 Essential (primary) hypertension: Secondary | ICD-10-CM

## 2018-01-15 DIAGNOSIS — K219 Gastro-esophageal reflux disease without esophagitis: Secondary | ICD-10-CM | POA: Diagnosis not present

## 2018-01-15 DIAGNOSIS — F419 Anxiety disorder, unspecified: Secondary | ICD-10-CM | POA: Diagnosis not present

## 2018-01-15 DIAGNOSIS — R911 Solitary pulmonary nodule: Secondary | ICD-10-CM

## 2018-01-15 DIAGNOSIS — R918 Other nonspecific abnormal finding of lung field: Secondary | ICD-10-CM

## 2018-01-15 MED ORDER — PROPRANOLOL HCL ER 80 MG PO CP24: 80.0000 mg | ORAL_CAPSULE | Freq: Every day | ORAL | 11 refills | Status: DC

## 2018-01-15 MED ORDER — AMLODIPINE BESYLATE 10 MG PO TABS: 10.0000 mg | ORAL_TABLET | Freq: Every day | ORAL | 11 refills | Status: DC

## 2018-01-15 MED ORDER — OMEPRAZOLE 20 MG PO CPDR: 20.0000 mg | DELAYED_RELEASE_CAPSULE | Freq: Every day | ORAL | 2 refills | Status: DC

## 2018-01-15 NOTE — Assessment & Plan Note (Signed)
He is going to try to quit; I am here to help if needed

## 2018-01-15 NOTE — Assessment & Plan Note (Signed)
Well-controlled; continue med, refills sent, watch salt intake; last creatinine Jan 2019 reviewed

## 2018-01-15 NOTE — Assessment & Plan Note (Signed)
Order chest CT to be done May 17, 2018; reviewed with patient

## 2018-01-15 NOTE — Assessment & Plan Note (Signed)
No red flags right now; glad he decreased the BC powders; Rx for PPI given; notify me if anything worrisome

## 2018-01-15 NOTE — Progress Notes (Signed)
BP 120/88   Pulse 64   Temp 98.6 F (37 C) (Oral)   Ht 5\' 9"  (1.753 m)   Wt 172 lb 8 oz (78.2 kg)   SpO2 98%   BMI 25.47 kg/m    Subjective:    Patient ID: Anthony Cardenas., male    DOB: [birth year], 47 y.o.   MRN: [id number]  HPI: Anthony Cardenas. is a 47 y.o. male  Chief Complaint  Patient presents with  . Follow-up    HPI Here for f/u  He had a swollen lymph node on the left, went to the hospital, saw ENT and had biopsies; necrosis and infection; he went back and saw them; had another round of antibiotics; no probmes with it since then  HTN; controlled; he has been going to bed earlier; in bed by 8:30 pm; helps a whole lot; does not use a whole lot of salt; works at [employer] and eats free there; quit taking BC powders, indigestion is better off of the [facility] powders; still has acid reflux though; nothing sticking; no blood in the stool Creatine in [place] was 0.87 (normal)  He has a cold; it was so bad Saturday that he couldn't smoke; he went home early from work; some wheezing but does not want inhaler; went a whole week without smoking and is going to try again; brother had a tip, left the cig in the ashtray and build up and then just don't buy any more and smoke what's left over in the ashtray  Lung nodule found on scan in January; he was told he has the beginnings of emphysema; he does not want a flu shot or pneumonia vaccine after discussion with MD  Depression screen [facility] 2/9 01/15/2018 05/29/2017 03/14/2016 08/24/2015  Decreased Interest 0 0 0 0  Down, Depressed, Hopeless 0 0 0 0  PHQ - 2 Score 0 0 0 0  Altered sleeping 0 - - -  Tired, decreased energy 0 - - -  Change in appetite 0 - - -  Feeling bad or failure about yourself  0 - - -  Trouble concentrating 0 - - -  Moving slowly or fidgety/restless 0 - - -  Suicidal thoughts 0 - - -  PHQ-9 Score 0 - - -   Fall Risk  01/15/2018 05/29/2017 03/14/2016 08/24/2015  Falls in the past year? Yes No No No  Number falls  in past yr: 1 - - -  Injury with Fall? No - - -    Relevant past medical, surgical, family and social history reviewed Past Medical History:  Diagnosis Date  . Dermatitis   . Hypertension   . Migraines   . Vitamin D deficiency disease    9.1 in Feb 2016   Past Surgical History:  Procedure Laterality Date  . DENTAL SURGERY  2011   Family History  Problem Relation Age of Onset  . Cancer Mother        lung  . COPD Mother   . Asthma Mother   . Hypertension Mother   . Cancer Paternal Grandfather        lung  . Heart disease Maternal Grandmother   . Hypertension Maternal Grandmother   . Stroke Maternal Grandmother   . Diabetes Neg Hx    Social History   Tobacco Use  . Smoking status: Current Every Day Smoker    Packs/day: 1.00    Types: Cigarettes  . Smokeless tobacco: Never Used  Substance Use Topics  .  Alcohol use: Yes    Comment: occasionally  . Drug use: No     Office Visit from 01/15/2018 in [facility]  AUDIT-C Score  4      Interim medical history since last visit reviewed. Allergies and medications reviewed  Review of Systems Per HPI unless specifically indicated above     Objective:    BP 120/88   Pulse 64   Temp 98.6 F (37 C) (Oral)   Ht 5\' 9"  (1.753 m)   Wt 172 lb 8 oz (78.2 kg)   SpO2 98%   BMI 25.47 kg/m   Wt Readings from Last 3 Encounters:  01/15/18 172 lb 8 oz (78.2 kg)  06/12/17 163 lb (73.9 kg)  05/29/17 169 lb 8 oz (76.9 kg)    Physical Exam  Constitutional: He appears well-developed and well-nourished. No distress.  Eyes: No scleral icterus.  Cardiovascular: Normal rate and regular rhythm.  Pulmonary/Chest: Effort normal. Wheezes: few expiratory wheezes.  Neurological: He is alert.  Skin: No pallor.  Psychiatric: He has a normal mood and affect.    Results for orders placed or performed during the hospital encounter of 06/12/17  CBC upon arrival  Result Value Ref Range   WBC 7.2 3.8 - 10.6 K/uL    RBC 4.60 4.40 - 5.90 MIL/uL   Hemoglobin 15.4 13.0 - 18.0 g/dL   HCT 16.1 09.6 - 04.5 %   MCV 98.2 80.0 - 100.0 fL   MCH 33.5 26.0 - 34.0 pg   MCHC 34.1 32.0 - 36.0 g/dL   RDW 40.9 81.1 - 91.4 %   Platelets 177 150 - 440 K/uL  Protime-INR upon arrival  Result Value Ref Range   Prothrombin Time 13.4 11.4 - 15.2 seconds   INR 1.03   Cytology - Non PAP; lymph node  Result Value Ref Range   CYTOLOGY - NON GYN      Cytology - Non PAP CASE: [id number] PATIENT: [name] Non-Gyn Cytology Report     SPECIMEN SUBMITTED: A. Lymph node, left cervical; FNA  CLINICAL HISTORY: Left neck swelling and likely suppurative lymphadenopathy  PRE-OPERATIVE DIAGNOSIS: Lymphadenopathy  POST-OPERATIVE DIAGNOSIS: None provided.     DIAGNOSIS: A. LYMPH NODE, LEFT CERVICAL; ULTRASOUND-GUIDED FNA: - NEGATIVE FOR ATYPICAL CELLS AND METASTASIS. - FEATURES CONSISTENT WITH REACTIVE LYMPHADENOPATHY.  Comment: The specimen is adequate for interpretation. The slides are moderately cellular. Small lymphocytes are the predominant cell type, admixed with occasional large lymphocytes and tingible body macrophages. There are no changes to suggest acute lymphadenitis or granulomatous inflammation in this sample.  The clinical history was reviewed in the preparation of this report. Follow-up is recommended to be sure the process resolves. If there is persistent lymphadenopathy, then additio nal tissue sampling may be necessary. FNA cannot exclude certain specific infections (for example, cat scratch disease) and neoplasms with partial lymph node involvement.  Slides reviewed: 5 Diff-Quik stained slides, 5 Pap stained slides, 1 ThinPrep slide, and 1 cell block   GROSS DESCRIPTION: A. Site: Left cervical lymph node Procedure: Ultrasound FNA Cytotechnologist: Zyretta Gordon Specimen(s) collected: 5 Diff Quik stained slides 5 Pap stained slides Specimen labeled left cervical lymph node:       Description: Pale pink, clear CytoLyt solution      Submitted for:           ThinPrep           Cell block(s): 1  Specimen in RPMI was retained until slide review completed; specimen was NOT sent  for flow cytometry. Final Diagnosis performed by Ronald LoboMary Olney, MD.  Electronically signed 06/13/2017 1:40:27PM    The electronic signature indicates that the named Attending Pathologist has evaluated the specimen  Technical component performed at [facility], [address], [place], [place] [address] Lab: [phone] Dir: Jolene SchimkeSanjai Nagendra, MD, MMM  Professional component performed at [facility], [facility], [address], [place], [place] [address] Lab: [phone] Dir: Georgiann Cockerara C. Oneita Krasubinas, MD    Surgical pathology  Result Value Ref Range   SURGICAL PATHOLOGY      Surgical Pathology CASE: [id number] PATIENT: Yannis Ridolfi Surgical Pathology Report     SPECIMEN SUBMITTED: A. Lymph node, left neck, biopsy  CLINICAL HISTORY: Left neck swelling, improved after antibiotics and steroids  PRE-OPERATIVE DIAGNOSIS: Suppurative lymphadenitis  POST-OPERATIVE DIAGNOSIS: None provided.     DIAGNOSIS: A. LYMPH NODE, LEFT NECK; ULTRASOUND-GUIDED CORE BIOPSY: - FIBRINOUS MATERIAL (IN CELL BLOCK).  Comment: The specimen contains insufficient lymphoid component for interpretation. Please see concurrent FNA report [id number], which was adequate for diagnosis.   GROSS DESCRIPTION: A. Labeled: Left neck lymph node Description: Received is a formalin container containing 30 mL of clear formalin. No soft tissue is grossly identified. A cell block is performed. Requisition notes that no core material was obtained, and that the needle was washed.  Final Diagnosis performed by Ronald LoboMary Olney, MD.  Electronically signed 06/13/18 19 1:43:22PM    The electronic signature indicates that the named Attending Pathologist has evaluated the specimen  Technical component  performed at [facility], [address], [place], [place] [address] Lab: [phone] Dir: Jolene SchimkeSanjai Nagendra, MD, MMM  Professional component performed at [facility], [facility], [address], [place], [place] [address] Lab: [phone] Dir: Georgiann Cockerara C. Oneita Krasubinas, MD        Assessment & Plan:   Problem List Items Addressed This Visit      Cardiovascular and Mediastinum   Hypertension (Chronic)    Well-controlled; continue med, refills sent, watch salt intake; last creatinine Jan 2019 reviewed      Relevant Medications   amLODipine (NORVASC) 10 MG tablet   propranolol ER (INDERAL LA) 80 MG 24 hr capsule     Digestive   GERD (gastroesophageal reflux disease)    No red flags right now; glad he decreased the BC powders; Rx for PPI given; notify me if anything worrisome      Relevant Medications   omeprazole (PRILOSEC) 20 MG capsule     Other   Anxiety   Relevant Medications   propranolol ER (INDERAL LA) 80 MG 24 hr capsule   Tobacco abuse    He is going to try to quit; I am here to help if needed      Pulmonary nodule, right    Order chest CT to be done May 17, 2018; reviewed with patient       Other Visit Diagnoses    Abnormal findings on diagnostic imaging of lung       Relevant Orders   CT Chest Wo Contrast       Follow up plan: Return in about 1 year (around 01/16/2019) for follow-up visit with Dr. Sherie DonLada; physical when due in the spring.  An after-visit summary was printed and given to the patient at check-out.  Please see the patient instructions which may contain other information and recommendations beyond what is mentioned above in the assessment and plan.  Meds ordered this encounter  Medications  . omeprazole (PRILOSEC) 20 MG capsule    Sig: Take  1 capsule (20 mg total) by mouth daily.    Dispense:  30 capsule    Refill:  2  . amLODipine (NORVASC) 10 MG tablet    Sig: Take 1 tablet (10 mg total) by mouth daily.    Dispense:  30 tablet     Refill:  11  . propranolol ER (INDERAL LA) 80 MG 24 hr capsule    Sig: Take 1 capsule (80 mg total) by mouth daily.    Dispense:  30 capsule    Refill:  11    Orders Placed This Encounter  Procedures  . CT Chest Wo Contrast

## 2018-01-15 NOTE — Patient Instructions (Addendum)
Front staff -- please request records from [facility] Nose Throat doctor  I do encourage you to quit smoking Call [phone] to sign up for smoking cessation classes You can call 1-800-QUIT-NOW to talk with a smoking cessation coach  Try to follow the DASH guidelines (DASH stands for Dietary Approaches to Stop Hypertension). Try to limit the sodium in your diet to no more than 1,500mg  of sodium per day. Certainly try to not exceed 2,000 mg per day at the very most. Do not add salt when cooking or at the table.  Check the sodium amount on labels when shopping, and choose items lower in sodium when given a choice. Avoid or limit foods that already contain a lot of sodium. Eat a diet rich in fruits and vegetables and whole grains, and try to lose weight if overweight or obese  You will be due for a chest CT on or just after May 17, 2018 Please call [phone] to schedule your imaging test Please wait 2-3 days after the order has been placed to call and get your test scheduled   Steps to Quit Smoking Smoking tobacco can be harmful to your health and can affect almost every organ in your body. Smoking puts you, and those around you, at risk for developing many serious chronic diseases. Quitting smoking is difficult, but it is one of the best things that you can do for your health. It is never too late to quit. What are the benefits of quitting smoking? When you quit smoking, you lower your risk of developing serious diseases and conditions, such as:  Lung cancer or lung disease, such as COPD.  Heart disease.  Stroke.  Heart attack.  Infertility.  Osteoporosis and bone fractures.  Additionally, symptoms such as coughing, wheezing, and shortness of breath may get better when you quit. You may also find that you get sick less often because your body is stronger at fighting off colds and infections. If you are pregnant, quitting smoking can help to reduce your chances of having a baby of low  birth weight. How do I get ready to quit? When you decide to quit smoking, create a plan to make sure that you are successful. Before you quit:  Pick a date to quit. Set a date within the next two weeks to give you time to prepare.  Write down the reasons why you are quitting. Keep this list in places where you will see it often, such as on your bathroom mirror or in your car or wallet.  Identify the people, places, things, and activities that make you want to smoke (triggers) and avoid them. Make sure to take these actions: ? Throw away all cigarettes at home, at work, and in your car. ? Throw away smoking accessories, such as Set designer. ? Clean your car and make sure to empty the ashtray. ? Clean your home, including curtains and carpets.  Tell your family, friends, and coworkers that you are quitting. Support from your loved ones can make quitting easier.  Talk with your health care provider about your options for quitting smoking.  Find out what treatment options are covered by your health insurance.  What strategies can I use to quit smoking? Talk with your healthcare provider about different strategies to quit smoking. Some strategies include:  Quitting smoking altogether instead of gradually lessening how much you smoke over a period of time. Research shows that quitting "cold [place]" is more successful than gradually quitting.  Attending in-person  counseling to help you build problem-solving skills. You are more likely to have success in quitting if you attend several counseling sessions. Even short sessions of 10 minutes can be effective.  Finding resources and support systems that can help you to quit smoking and remain smoke-free after you quit. These resources are most helpful when you use them often. They can include: ? Online chats with a Veterinary surgeon. ? Telephone quitlines. ? Automotive engineer. ? Support groups or group counseling. ? Text messaging  programs. ? Mobile phone applications.  Taking medicines to help you quit smoking. (If you are pregnant or breastfeeding, talk with your health care provider first.) Some medicines contain nicotine and some do not. Both types of medicines help with cravings, but the medicines that include nicotine help to relieve withdrawal symptoms. Your health care provider may recommend: ? Nicotine patches, gum, or lozenges. ? Nicotine inhalers or sprays. ? Non-nicotine medicine that is taken by mouth.  Talk with your health care provider about combining strategies, such as taking medicines while you are also receiving in-person counseling. Using these two strategies together makes you more likely to succeed in quitting than if you used either strategy on its own. If you are pregnant or breastfeeding, talk with your health care provider about finding counseling or other support strategies to quit smoking. Do not take medicine to help you quit smoking unless told to do so by your health care provider. What things can I do to make it easier to quit? Quitting smoking might feel overwhelming at first, but there is a lot that you can do to make it easier. Take these important actions:  Reach out to your family and friends and ask that they support and encourage you during this time. Call telephone quitlines, reach out to support groups, or work with a counselor for support.  Ask people who smoke to avoid smoking around you.  Avoid places that trigger you to smoke, such as bars, parties, or smoke-break areas at work.  Spend time around people who do not smoke.  Lessen stress in your life, because stress can be a smoking trigger for some people. To lessen stress, try: ? Exercising regularly. ? Deep-breathing exercises. ? Yoga. ? Meditating. ? Performing a body scan. This involves closing your eyes, scanning your body from head to toe, and noticing which parts of your body are particularly tense. Purposefully  relax the muscles in those areas.  Download or purchase mobile phone or tablet apps (applications) that can help you stick to your quit plan by providing reminders, tips, and encouragement. There are many free apps, such as QuitGuide from the [employer] developer for Disease Control and Prevention). You can find other support for quitting smoking (smoking cessation) through smokefree.gov and other websites.  How will I feel when I quit smoking? Within the first 24 hours of quitting smoking, you may start to feel some withdrawal symptoms. These symptoms are usually most noticeable 2-3 days after quitting, but they usually do not last beyond 2-3 weeks. Changes or symptoms that you might experience include:  Mood swings.  Restlessness, anxiety, or irritation.  Difficulty concentrating.  Dizziness.  Strong cravings for sugary foods in addition to nicotine.  Mild weight gain.  Constipation.  Nausea.  Coughing or a sore throat.  Changes in how your medicines work in your body.  A depressed mood.  Difficulty sleeping (insomnia).  After the first 2-3 weeks of quitting, you may start to notice more positive results, such as:  Improved sense of smell and taste.  Decreased coughing and sore throat.  Slower heart rate.  Lower blood pressure.  Clearer skin.  The ability to breathe more easily.  Fewer sick days.  Quitting smoking is very challenging for most people. Do not get discouraged if you are not successful the first time. Some people need to make many attempts to quit before they achieve long-term success. Do your best to stick to your quit plan, and talk with your health care provider if you have any questions or concerns. This information is not intended to replace advice given to you by your health care provider. Make sure you discuss any questions you have with your health care provider. Document Released: 04/04/2001 Document Revised: 12/07/2015 Document Reviewed:  08/25/2014 Elsevier Interactive Patient Education  [employer]2018 Elsevier Inc.

## 2018-01-17 ENCOUNTER — Telehealth: Payer: Self-pay | Admitting: Family Medicine

## 2018-01-17 MED ORDER — ALBUTEROL SULFATE HFA 108 (90 BASE) MCG/ACT IN AERS: 2.0000 | INHALATION_SPRAY | RESPIRATORY_TRACT | 1 refills | Status: DC | PRN

## 2018-01-17 NOTE — Telephone Encounter (Signed)
If trouble breathing, significant wheezing, sputum production, please come back or go to urgent care Rx for SABA sent

## 2018-01-17 NOTE — Telephone Encounter (Signed)
Copied from CRM [id number]. Topic: General - Other >> Jan 17, 2018 12:48 PM Tamela Oddi wrote: Reason for CRM: Patient called to request that doctor Lada order him the inhaler that they spoke about at his last visit with her last week.  Patient said he is not getting any better and would like to try the inhaler.  Please advise.  CB# [phone].

## 2018-01-24 ENCOUNTER — Telehealth: Payer: Self-pay | Admitting: Family Medicine

## 2018-02-01 ENCOUNTER — Ambulatory Visit: Admission: RE | Admit: 2018-02-01 | Payer: Commercial Managed Care - PPO | Source: Ambulatory Visit

## 2018-04-26 ENCOUNTER — Telehealth: Payer: Self-pay | Admitting: Family Medicine

## 2018-04-26 DIAGNOSIS — R911 Solitary pulmonary nodule: Secondary | ICD-10-CM

## 2018-04-26 NOTE — Telephone Encounter (Signed)
Please help patient get the chest CT scheduled for later this month; already ordered; thank you

## 2018-04-26 NOTE — Assessment & Plan Note (Signed)
Order repeat chest CT

## 2018-04-29 NOTE — Telephone Encounter (Signed)
Tried to call pt - no voicemail set up

## 2018-04-30 NOTE — Telephone Encounter (Signed)
Pt.notified

## 2019-01-15 ENCOUNTER — Ambulatory Visit: Payer: Commercial Managed Care - PPO | Admitting: Family Medicine

## 2019-01-17 ENCOUNTER — Other Ambulatory Visit: Payer: Self-pay | Admitting: Emergency Medicine

## 2019-01-17 DIAGNOSIS — I10 Essential (primary) hypertension: Secondary | ICD-10-CM

## 2019-01-17 DIAGNOSIS — F419 Anxiety disorder, unspecified: Secondary | ICD-10-CM

## 2019-01-17 NOTE — Telephone Encounter (Signed)
Copied from [place] [id number]. Topic: General - Inquiry >> Jan 17, 2019 12:41 PM Percell Belt A wrote: Reason for CRM: pt called in and stated that he was a pt of Dr Sanda Klein and had an appt on 9/23 that had to be rech with someone else.  He would like to know if he can get enough meds to last to his appt with emaily on oct 7th  propranolol ER (INDERAL LA) 80 MG 24 hr capsule [[id number] amLODipine (NORVASC) 10 MG tablet [facility]

## 2019-01-18 MED ORDER — AMLODIPINE BESYLATE 10 MG PO TABS: 10.0000 mg | ORAL_TABLET | Freq: Every day | ORAL | 0 refills | Status: DC

## 2019-01-18 MED ORDER — PROPRANOLOL HCL ER 80 MG PO CP24: 80.0000 mg | ORAL_CAPSULE | Freq: Every day | ORAL | 0 refills | Status: DC

## 2019-01-31 ENCOUNTER — Encounter: Payer: Self-pay | Admitting: Family Medicine

## 2019-01-31 ENCOUNTER — Ambulatory Visit: Payer: Commercial Managed Care - PPO | Admitting: Family Medicine

## 2019-01-31 ENCOUNTER — Other Ambulatory Visit: Payer: Self-pay

## 2019-01-31 VITALS — BP 138/86 | HR 97 | Temp 97.7°F | Resp 18 | Ht 69.0 in | Wt 195.7 lb

## 2019-01-31 DIAGNOSIS — I10 Essential (primary) hypertension: Secondary | ICD-10-CM | POA: Diagnosis not present

## 2019-01-31 DIAGNOSIS — J439 Emphysema, unspecified: Secondary | ICD-10-CM | POA: Diagnosis not present

## 2019-01-31 DIAGNOSIS — Z13220 Encounter for screening for lipoid disorders: Secondary | ICD-10-CM

## 2019-01-31 DIAGNOSIS — R911 Solitary pulmonary nodule: Secondary | ICD-10-CM | POA: Diagnosis not present

## 2019-01-31 DIAGNOSIS — E663 Overweight: Secondary | ICD-10-CM | POA: Insufficient documentation

## 2019-01-31 DIAGNOSIS — D709 Neutropenia, unspecified: Secondary | ICD-10-CM

## 2019-01-31 DIAGNOSIS — D696 Thrombocytopenia, unspecified: Secondary | ICD-10-CM

## 2019-01-31 DIAGNOSIS — Z720 Tobacco use: Secondary | ICD-10-CM | POA: Diagnosis not present

## 2019-01-31 DIAGNOSIS — R42 Dizziness and giddiness: Secondary | ICD-10-CM

## 2019-01-31 DIAGNOSIS — E559 Vitamin D deficiency, unspecified: Secondary | ICD-10-CM

## 2019-01-31 DIAGNOSIS — F419 Anxiety disorder, unspecified: Secondary | ICD-10-CM

## 2019-01-31 DIAGNOSIS — I889 Nonspecific lymphadenitis, unspecified: Secondary | ICD-10-CM

## 2019-01-31 DIAGNOSIS — Z131 Encounter for screening for diabetes mellitus: Secondary | ICD-10-CM

## 2019-01-31 MED ORDER — BUPROPION HCL ER (XL) 150 MG PO TB24: 150.0000 mg | ORAL_TABLET | Freq: Every day | ORAL | 1 refills | Status: DC

## 2019-01-31 NOTE — Patient Instructions (Addendum)

## 2019-01-31 NOTE — Progress Notes (Signed)
Name: Anthony Cardenas.   MRN: [id number]    DOB: 30-Sep-1970   Date:01/31/2019       Progress Note  Subjective  Chief Complaint  Chief Complaint  Patient presents with  . Hypertension    follow up, medication refills    HPI  History Lymphadenitis: was hospitalised in early 2019 for this; saw Dr. Willeen Cass with ENT and it was found to be infection/necrotic tissue.  No issues since then.  Tobacco Use/Emphysema: Still smoking, but is down to 2-4 cigarettes/day.  Declines medication - did not like Chantix in the past.  Would like to try Wellbutrin again - we will send this in today.  CT scan found emphysema and RUL lung nodule in January 2019. PT denies chest pain or shortness of breath, wheezing; does have a daily morning cough at present. 12 month follow up CT was recommended if high risk - will refer to pulmonary nodule clinic today.  HTN: Taking medications as prescribed - current regimen includes propanolol, amlodipine for BP control at this time.  Denies chest pain, shortness of breath, BLE edema, or palpitations.  DASH diet discussed. Pt does not check BP at home.  Anxiety: Anxiety has been under good control; also has a slight tremor in bilateral hands.  He notes mood has been very good, propanolol helps with tremor and anxiety.  He also notes that after he came off of Chantix his anxiety has been much better.  He has done well on Wellbutrin in the past without increase in anxiety - we will Rx for smoking cessation.   History Neutropenia and thrombocytopenia: Appears to have resolved with no issues int he last 3 years of labs available.  Denies fatigue or issues with clotting/easily bruising.  Lightheadedness: He does endorse lightheadedness occasionally - BP is elevated at 150's/90's and HR is usually in the 70-80's when this happens.  Notes that sitting down and eating something does seem to help.  Has had about 5 episodes in the last month.  No strong family history of  diabetes. Gets up at 4am and sometimes doesn't eat until 12pm.  No blood in stool or urine, no fatigue, palpitations, headaches, vision changes. Not always drinking enough water.  He has decreased his smoking quite a bit over the last few weeks.  Vitamin D Deficiency: Not taking a supplement; due for labs; no fatigue.  Patient Active Problem List   Diagnosis Date Noted  . Thrombocytopenia (HCC) 01/31/2019  . Overweight (BMI 25.0-29.9) 01/31/2019  . GERD (gastroesophageal reflux disease) 01/15/2018  . Emphysema, unspecified (HCC) 05/29/2017  . Pulmonary nodule, right 05/29/2017  . Lymphadenitis 05/16/2017  . Chronic pain of right ankle 03/14/2016  . Anxiety 09/12/2015  . Medication monitoring encounter 05/18/2015  . Tobacco abuse 04/11/2015  . Hypertension   . Vitamin D deficiency disease   . Dermatitis     Past Surgical History:  Procedure Laterality Date  . DENTAL SURGERY  2011    Family History  Problem Relation Age of Onset  . Cancer Mother        lung  . COPD Mother   . Asthma Mother   . Hypertension Mother   . Cancer Paternal Grandfather        lung  . Heart disease Maternal Grandmother   . Hypertension Maternal Grandmother   . Stroke Maternal Grandmother   . Diabetes Neg Hx     Social History   Socioeconomic History  . Marital status: Married  Spouse name: Not on file  . Number of children: Not on file  . Years of education: Not on file  . Highest education level: Not on file  Occupational History    Employer: [employer]  Social Needs  . Financial resource strain: Not on file  . Food insecurity    Worry: Not on file    Inability: Not on file  . Transportation needs    Medical: Not on file    Non-medical: Not on file  Tobacco Use  . Smoking status: Current Every Day Smoker    Packs/day: 1.00    Types: Cigarettes  . Smokeless tobacco: Never Used  Substance and Sexual Activity  . Alcohol use: Yes    Comment: occasionally  . Drug use: No  .  Sexual activity: Yes  Lifestyle  . Physical activity    Days per week: Not on file    Minutes per session: Not on file  . Stress: Not on file  Relationships  . Social Musicianconnections    Talks on phone: Not on file    Gets together: Not on file    Attends religious service: Not on file    Active member of club or organization: Not on file    Attends meetings of clubs or organizations: Not on file    Relationship status: Not on file  . Intimate partner violence    Fear of current or ex partner: Not on file    Emotionally abused: Not on file    Physically abused: Not on file    Forced sexual activity: Not on file  Other Topics Concern  . Not on file  Social History Narrative  . Not on file     Current Outpatient Medications:  .  albuterol (PROVENTIL HFA;VENTOLIN HFA) 108 (90 Base) MCG/ACT inhaler, Inhale 2 puffs into the lungs every 4 (four) hours as needed for wheezing or shortness of breath., Disp: 1 Inhaler, Rfl: 1 .  amLODipine (NORVASC) 10 MG tablet, Take 1 tablet (10 mg total) by mouth daily., Disp: 30 tablet, Rfl: 0 .  cholecalciferol (VITAMIN D) 1000 units tablet, Take 1,000 Units by mouth daily., Disp: , Rfl:  .  omeprazole (PRILOSEC) 20 MG capsule, Take 1 capsule (20 mg total) by mouth daily., Disp: 30 capsule, Rfl: 2 .  propranolol ER (INDERAL LA) 80 MG 24 hr capsule, Take 1 capsule (80 mg total) by mouth daily., Disp: 30 capsule, Rfl: 0  Allergies  Allergen Reactions  . Chantix [Varenicline] Other (See Comments)    Bad dreams, anxiety    I personally reviewed active problem list, medication list, allergies, health maintenance, notes from last encounter, lab results with the patient/caregiver today.   ROS  Ten systems reviewed and is negative except as mentioned in HPI  Objective  Vitals:   01/31/19 0949  BP: 138/86  Pulse: 97  Resp: 18  Temp: 97.7 F (36.5 C)  TempSrc: Oral  SpO2: 98%  Weight: 195 lb 11.2 oz (88.8 kg)  Height: 5\' 9"  (1.753 m)   Body  mass index is 28.9 kg/m.  Physical Exam  Constitutional: Patient appears well-developed and well-nourished. No distress.  HENT: Head: Normocephalic and atraumatic. Ears: bilateral TMs with no erythema or effusion; Nose: Nose normal. Mouth/Throat: Oropharynx is clear and moist. No oropharyngeal exudate or tonsillar swelling.  Eyes: Conjunctivae and EOM are normal. No scleral icterus.  Pupils are equal, round, and reactive to light.  Neck: Normal range of motion. Neck supple. No JVD present. No thyromegaly  present.  Cardiovascular: Normal rate, regular rhythm and normal heart sounds.  No murmur heard. No BLE edema. Pulmonary/Chest: Effort normal and breath sounds normal. No respiratory distress. Musculoskeletal: Normal range of motion, no joint effusions. No gross deformities Neurological: Pt is alert and oriented to person, place, and time. No cranial nerve deficit. Coordination, balance, strength, speech and gait are normal.  Skin: Skin is warm and dry. No rash noted. No erythema.  Psychiatric: Patient has a normal mood and affect. behavior is normal. Judgment and thought content normal.  No results found for this or any previous visit (from the past 72 hour(s)).   PHQ2/9: Depression screen [facility] 2/9 01/31/2019 01/15/2018 05/29/2017 03/14/2016 08/24/2015  Decreased Interest 0 0 0 0 0  Down, Depressed, Hopeless 0 0 0 0 0  PHQ - 2 Score 0 0 0 0 0  Altered sleeping 0 0 - - -  Tired, decreased energy 0 0 - - -  Change in appetite 0 0 - - -  Feeling bad or failure about yourself  0 0 - - -  Trouble concentrating 0 0 - - -  Moving slowly or fidgety/restless 0 0 - - -  Suicidal thoughts 0 0 - - -  PHQ-9 Score 0 0 - - -  Difficult doing work/chores Not difficult at all - - - -   PHQ-2/9 Result is negative.    Fall Risk: Fall Risk  01/31/2019 01/15/2018 05/29/2017 03/14/2016 08/24/2015  Falls in the past year? 0 Yes No No No  Number falls in past yr: 0 1 - - -  Injury with Fall? 0 No - - -  Follow  up Falls evaluation completed - - - -    Assessment & Plan  1. Pulmonary emphysema, unspecified emphysema type ([place]) - Referral to pulmonary nodule clinic for repeat CT scan and ongoing follow up - CBC with Differential/Platelet - AMB  Referral to Pulmonary Nodule Clinic  2. Pulmonary nodule, right - AMB  Referral to Pulmonary Nodule Clinic - Referral to pulmonary nodule clinic for repeat CT scan and ongoing follow up  3. Tobacco abuse - AMB  Referral to Pulmonary Nodule Clinic - buPROPion (WELLBUTRIN XL) 150 MG 24 hr tablet; Take 1 tablet (150 mg total) by mouth daily.  Dispense: 90 tablet; Refill: 1  4. Essential hypertension - DASH diet - COMPLETE METABOLIC PANEL WITH GFR  5. Anxiety - buPROPion (WELLBUTRIN XL) 150 MG 24 hr tablet; Take 1 tablet (150 mg total) by mouth daily.  Dispense: 90 tablet; Refill: 1  6. Thrombocytopenia (HCC) - CBC with Differential/Platelet  7. Neutropenia, unspecified type ([place]) - CBC with Differential/Platelet  8. Lymphadenitis - Resolved  9. Vitamin D deficiency disease - VITAMIN D 25 Hydroxy (Vit-D Deficiency, Fractures)  10. Overweight (BMI 25.0-29.9) - Discussed importance of 150 minutes of physical activity weekly, eat two servings of fish weekly, eat one serving of tree nuts ( cashews, pistachios, pecans, almonds.Marland Kitchen) every other day, eat 6 servings of fruit/vegetables daily and drink plenty of water and avoid sweet beverages.   11. Lipid screening - Lipid panel  12. Episodic lightheadedness - Advised to eat high protein, high fiber, lower carb diet with regular meals and snacks throughout the day, track symptoms. - CBC with Differential/Platelet - COMPLETE METABOLIC PANEL WITH GFR - TSH - Hemoglobin A1c - EKG today as he is mildly symptomatic and EKG is normal. - Orthostatic Vitals - show elevation in BP upon standing - we will increase amlodipine to 15mg  daily and  maintain propanolol, await labs, and follow up for recheck in 2  weeks.  13. Diabetes mellitus screening - Hemoglobin A1c

## 2019-02-01 LAB — CBC WITH DIFFERENTIAL/PLATELET
Absolute Monocytes: 402 cells/uL (ref 200–950)
Basophils Absolute: 50 cells/uL (ref 0–200)
Basophils Relative: 0.9 %
Eosinophils Absolute: 182 cells/uL (ref 15–500)
Eosinophils Relative: 3.3 %
HCT: 44.9 % (ref 38.5–50.0)
Hemoglobin: 15.8 g/dL (ref 13.2–17.1)
Lymphs Abs: 1260 cells/uL (ref 850–3900)
MCH: 34.5 pg — ABNORMAL HIGH (ref 27.0–33.0)
MCHC: 35.2 g/dL (ref 32.0–36.0)
MCV: 98 fL (ref 80.0–100.0)
MPV: 10.6 fL (ref 7.5–12.5)
Monocytes Relative: 7.3 %
Neutro Abs: 3608 cells/uL (ref 1500–7800)
Neutrophils Relative %: 65.6 %
Platelets: 172 10*3/uL (ref 140–400)
RBC: 4.58 10*6/uL (ref 4.20–5.80)
RDW: 12.5 % (ref 11.0–15.0)
Total Lymphocyte: 22.9 %
WBC: 5.5 10*3/uL (ref 3.8–10.8)

## 2019-02-01 LAB — LIPID PANEL
Cholesterol: 160 mg/dL (ref <200)
HDL: 62 mg/dL (ref >=40)
LDL Cholesterol (Calc): 74 mg/dL (calc)
Non-HDL Cholesterol (Calc): 98 mg/dL (calc) (ref <130)
Total CHOL/HDL Ratio: 2.6 (calc) (ref <5.0)
Triglycerides: 159 mg/dL — ABNORMAL HIGH (ref <150)

## 2019-02-01 LAB — COMPLETE METABOLIC PANEL WITH GFR
AG Ratio: 2.1 (calc) (ref 1.0–2.5)
ALT: 68 U/L — ABNORMAL HIGH (ref 9–46)
AST: 58 U/L — ABNORMAL HIGH (ref 10–40)
Albumin: 4.4 g/dL (ref 3.6–5.1)
Alkaline phosphatase (APISO): 76 U/L (ref 36–130)
BUN: 14 mg/dL (ref 7–25)
CO2: 28 mmol/L (ref 20–32)
Calcium: 9.3 mg/dL (ref 8.6–10.3)
Chloride: 107 mmol/L (ref 98–110)
Creat: 0.87 mg/dL (ref 0.60–1.35)
GFR, Est African American: 119 mL/min/{1.73_m2} (ref >=60)
GFR, Est Non African American: 103 mL/min/{1.73_m2} (ref >=60)
Globulin: 2.1 g/dL (calc) (ref 1.9–3.7)
Glucose, Bld: 101 mg/dL — ABNORMAL HIGH (ref 65–99)
Potassium: 4 mmol/L (ref 3.5–5.3)
Sodium: 142 mmol/L (ref 135–146)
Total Bilirubin: 0.6 mg/dL (ref 0.2–1.2)
Total Protein: 6.5 g/dL (ref 6.1–8.1)

## 2019-02-01 LAB — HEMOGLOBIN A1C
Hgb A1c MFr Bld: 5.5 % of total Hgb (ref <5.7)
Mean Plasma Glucose: 111 (calc)
eAG (mmol/L): 6.2 (calc)

## 2019-02-01 LAB — VITAMIN D 25 HYDROXY (VIT D DEFICIENCY, FRACTURES): Vit D, 25-Hydroxy: 11 ng/mL — ABNORMAL LOW (ref 30–100)

## 2019-02-01 LAB — TSH: TSH: 2.64 mIU/L (ref 0.40–4.50)

## 2019-02-03 ENCOUNTER — Other Ambulatory Visit: Payer: Self-pay | Admitting: Family Medicine

## 2019-02-03 DIAGNOSIS — R7989 Other specified abnormal findings of blood chemistry: Secondary | ICD-10-CM

## 2019-02-03 DIAGNOSIS — R945 Abnormal results of liver function studies: Secondary | ICD-10-CM

## 2019-02-03 DIAGNOSIS — E559 Vitamin D deficiency, unspecified: Secondary | ICD-10-CM

## 2019-02-03 MED ORDER — VITAMIN D (ERGOCALCIFEROL) 1.25 MG (50000 UNIT) PO CAPS: 50000.0000 [IU] | ORAL_CAPSULE | ORAL | 0 refills | Status: DC

## 2019-02-14 ENCOUNTER — Ambulatory Visit: Payer: Commercial Managed Care - PPO | Admitting: Family Medicine

## 2019-02-18 ENCOUNTER — Ambulatory Visit (INDEPENDENT_AMBULATORY_CARE_PROVIDER_SITE_OTHER): Payer: Commercial Managed Care - PPO | Admitting: Family Medicine

## 2019-02-18 ENCOUNTER — Other Ambulatory Visit: Payer: Self-pay | Admitting: Oncology

## 2019-02-18 ENCOUNTER — Encounter: Payer: Self-pay | Admitting: Family Medicine

## 2019-02-18 ENCOUNTER — Other Ambulatory Visit: Payer: Self-pay | Admitting: Family Medicine

## 2019-02-18 ENCOUNTER — Other Ambulatory Visit: Payer: Self-pay

## 2019-02-18 VITALS — BP 142/86 | HR 80 | Temp 97.5°F | Resp 18 | Ht 69.0 in | Wt 194.1 lb

## 2019-02-18 DIAGNOSIS — I10 Essential (primary) hypertension: Secondary | ICD-10-CM | POA: Diagnosis not present

## 2019-02-18 DIAGNOSIS — R911 Solitary pulmonary nodule: Secondary | ICD-10-CM

## 2019-02-18 DIAGNOSIS — R7989 Other specified abnormal findings of blood chemistry: Secondary | ICD-10-CM

## 2019-02-18 DIAGNOSIS — R42 Dizziness and giddiness: Secondary | ICD-10-CM | POA: Diagnosis not present

## 2019-02-18 DIAGNOSIS — F419 Anxiety disorder, unspecified: Secondary | ICD-10-CM

## 2019-02-18 MED ORDER — ESCITALOPRAM OXALATE 10 MG PO TABS: ORAL_TABLET | ORAL | 1 refills | Status: DC

## 2019-02-18 MED ORDER — HYDROXYZINE HCL 10 MG PO TABS: 10.0000 mg | ORAL_TABLET | Freq: Three times a day (TID) | ORAL | 1 refills | Status: DC | PRN

## 2019-02-18 NOTE — Progress Notes (Signed)
  Pulmonary Nodule Clinic Telephone Note  Received referral from [facility] Ensign, [facility].  Patient was evaluated back in January 2019 for acute left-sided neck swelling with pain and subjective fevers.  Had CT soft tissue neck revealed 4 mm right upper lobe nodule.  It recommended a noncontrast chest CT in approximately 12 months if patient is considered high risk.  I personally reviewed all patient's previous imaging.  I recommend follow-up with noncontrasted CT scan of the chest in the next 1 to 2 weeks.  Patient is a current everyday smoker.   High risk factors include: History of heavy smoking, exposure to asbestos, radium or uranium, personal family history of lung cancer, older age, sex (females greater than males), race (black and native [place] greater than weight), marginal speculation, upper lobe location, multiplicity (less than 5 nodules increases risk for malignancy) and emphysema and/or pulmonary fibrosis.   This recommendation follows the consensus statement: Guidelines for Management of Incidental Pulmonary Nodules Detected on CT Images: From the Fleischner Society 2017; Radiology 2017; 284:228-243.    I have placed order for CT scan without contrast to be completed approximately 1-2 weeks.    I will touch base with patient and schedule him/her virtually for results of the CT scan and recommendations per Fleischner's guidelines and our pulmonary nodule clinic.  Faythe Casa, NP 02/18/2019 3:23 PM
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
## 2019-02-18 NOTE — Patient Instructions (Signed)
Here are some resources to help you if you feel you are in a mental health crisis:  National Suicide Prevention Lifeline - Call 1-800-273-8255  for help - Website with more resources: https://suicidepreventionlifeline.org/  [facility] - Call 866-275-9552 for help. - Mobile Crisis Program available 24 hours a day, 365 days a year. - Available for anyone of any age in [place] & [place] counties.  [facility] - Address: [address], [place] - Telephone: [phone]  - Hours of Operation: Sunday - Saturday - 8:00 a.m. - 8:00 p.m. - Medicaid, Medicare (Government Issued Only), BCBS, and Cash - Pay - Crisis Management, Outpatient Individual & Group Therapy, Psychiatrists on-site to provide medication management, In-Home Psychiatric Care, and Peer Support Care.  National Mobile Crisis: 1-800-939-5911 - Mobile Crisis Program available 24 hours a day, 365 days a year. - Available for anyone of any age in  & [place]    

## 2019-02-19 ENCOUNTER — Telehealth: Payer: Self-pay | Admitting: *Deleted

## 2019-02-19 LAB — COMPLETE METABOLIC PANEL WITH GFR
AG Ratio: 2.1 (calc) (ref 1.0–2.5)
ALT: 56 U/L — ABNORMAL HIGH (ref 9–46)
AST: 43 U/L — ABNORMAL HIGH (ref 10–40)
Albumin: 4.4 g/dL (ref 3.6–5.1)
Alkaline phosphatase (APISO): 66 U/L (ref 36–130)
BUN: 16 mg/dL (ref 7–25)
CO2: 28 mmol/L (ref 20–32)
Calcium: 9.6 mg/dL (ref 8.6–10.3)
Chloride: 103 mmol/L (ref 98–110)
Creat: 0.81 mg/dL (ref 0.60–1.35)
GFR, Est African American: 123 mL/min/{1.73_m2} (ref >=60)
GFR, Est Non African American: 106 mL/min/{1.73_m2} (ref >=60)
Globulin: 2.1 g/dL (calc) (ref 1.9–3.7)
Glucose, Bld: 91 mg/dL (ref 65–99)
Potassium: 3.8 mmol/L (ref 3.5–5.3)
Sodium: 138 mmol/L (ref 135–146)
Total Bilirubin: 1.1 mg/dL (ref 0.2–1.2)
Total Protein: 6.5 g/dL (ref 6.1–8.1)

## 2019-02-19 NOTE — Telephone Encounter (Signed)
Attempted to call patient to discuss referral to lung nodule clinic and review upcoming appts. Pt did not answer and unable to leave a message at this time. Will try again later.

## 2019-02-25 ENCOUNTER — Telehealth: Payer: Self-pay | Admitting: *Deleted

## 2019-02-25 NOTE — Telephone Encounter (Signed)
Pt made aware of referral to Lung Nodule Clinic from PCP. Pt is in agreement to have upcoming CT scan and follow up as recommended.  Pt has been made aware of upcoming appts for follow up CT scan and follow up appt with Jennifer Burns, NP in the Lung Nodule Clinic. Pt verbalized understanding. Nothing further needed at this time.  

## 2019-02-28 ENCOUNTER — Other Ambulatory Visit: Payer: Commercial Managed Care - PPO

## 2019-03-03 ENCOUNTER — Ambulatory Visit: Payer: Commercial Managed Care - PPO | Admitting: Oncology

## 2019-03-03 ENCOUNTER — Ambulatory Visit: Payer: Commercial Managed Care - PPO

## 2019-03-04 ENCOUNTER — Other Ambulatory Visit: Payer: Self-pay

## 2019-03-04 ENCOUNTER — Inpatient Hospital Stay: Payer: Commercial Managed Care - PPO | Admitting: Oncology

## 2019-03-04 NOTE — Progress Notes (Incomplete)
Pulmonary Nodule Clinic Consult note Anthony Cardenas  [phone] [phone]  Patient Care Team: Anthony Cardenas, Emily E, FNP as PCP - General (Family Medicine)   Name of the patient: Anthony LesserRalph Cardenas  [id number]  02-01-1971   Date of visit: 03/04/2019   Diagnosis- Lung Nodule  Virtual Visit via Telephone Note   I connected with Anthony Cardenas on 03/04/19 at 3:00pm by telephone visit and verified that I am speaking with the correct person using two identifiers.   I discussed the limitations, risks, security and privacy concerns of performing an evaluation and management service by telemedicine and the availability of in-person appointments. I also discussed with the patient that there may be a patient responsible charge related to this service. The patient expressed understanding and agreed to proceed.   Other persons participating in the visit and their role in the encounter:   Patient's location: Home  Provider's location: Office  Chief complaint/ Reason for visit- Pulmonary Nodule Clinic Initial Visit  Past Medical History:  Patient is managed/referred by Maurice SmallEmily Boyce.   Patient was evaluated back in January thousand 19 for acute left-sided neck swelling with pain and subjective fevers.  Had CT soft tissue neck revealed 4 mm right upper lobe nodule.  I recommended a noncontrasted chest CT in approximately 12 months if patient is considered high risk.  Patient is a current everyday smoker.  Interval history-patient presents to clinic today for evaluation and results of recent CT chest without contrast.   Patient has past medical history positive for: Past Medical History:  Diagnosis Date  . Dermatitis   . Hypertension   . Migraines   . Vitamin D deficiency disease    9.1 in Feb 2016    Patient has past surgical history positive for: Past Surgical History:  Procedure Laterality Date  . DENTAL SURGERY  2011    Denies any neurologic  complaints. Denies recent fevers or illnesses. Denies any easy bleeding or bruising. Reports good appetite and denies weight loss. Denies chest pain. Denies any nausea, vomiting, constipation, or diarrhea. Denies urinary complaints. Patient offers no further specific complaints today.   ECOG FS:0 - Asymptomatic  Review of systems- ROS   Allergies  Allergen Reactions  . Chantix [Varenicline] Other (See Comments)    Bad dreams, anxiety     Past Medical History:  Diagnosis Date  . Dermatitis   . Hypertension   . Migraines   . Vitamin D deficiency disease    9.1 in Feb 2016     Past Surgical History:  Procedure Laterality Date  . DENTAL SURGERY  2011    Social History   Socioeconomic History  . Marital status: Married    Spouse name: Not on file  . Number of children: Not on file  . Years of education: Not on file  . Highest education level: Not on file  Occupational History    Employer: [employer]  Social Needs  . Financial resource strain: Not on file  . Food insecurity    Worry: Not on file    Inability: Not on file  . Transportation needs    Medical: Not on file    Non-medical: Not on file  Tobacco Use  . Smoking status: Current Every Day Smoker    Packs/day: 1.00    Types: Cigarettes  . Smokeless tobacco: Never Used  Substance and Sexual Activity  . Alcohol use: Yes    Comment: occasionally  . Drug use: No  . Sexual activity: Yes  Lifestyle  . Physical activity    Days per week: Not on file    Minutes per session: Not on file  . Stress: Not on file  Relationships  . Social Herbalist on phone: Not on file    Gets together: Not on file    Attends religious service: Not on file    Active member of club or organization: Not on file    Attends meetings of clubs or organizations: Not on file    Relationship status: Not on file  . Intimate partner violence    Fear of current or ex partner: Not on file    Emotionally abused: Not on file     Physically abused: Not on file    Forced sexual activity: Not on file  Other Topics Concern  . Not on file  Social History Narrative  . Not on file    Family History  Problem Relation Age of Onset  . Cancer Mother        lung  . COPD Mother   . Asthma Mother   . Hypertension Mother   . Cancer Paternal Grandfather        lung  . Heart disease Maternal Grandmother   . Hypertension Maternal Grandmother   . Stroke Maternal Grandmother   . Diabetes Neg Hx      Current Outpatient Medications:  .  albuterol (PROVENTIL HFA;VENTOLIN HFA) 108 (90 Base) MCG/ACT inhaler, Inhale 2 puffs into the lungs every 4 (four) hours as needed for wheezing or shortness of breath., Disp: 1 Inhaler, Rfl: 1 .  amLODipine (NORVASC) 10 MG tablet, Take 1 tablet (10 mg total) by mouth daily., Disp: 90 tablet, Rfl: 1 .  buPROPion (WELLBUTRIN XL) 150 MG 24 hr tablet, Take 1 tablet (150 mg total) by mouth daily., Disp: 90 tablet, Rfl: 1 .  cholecalciferol (VITAMIN D) 1000 units tablet, Take 1,000 Units by mouth daily., Disp: , Rfl:  .  escitalopram (LEXAPRO) 10 MG tablet, Take 1/2 tablet once daily for 7 days, then increase to 1 tablet once daily., Disp: 30 tablet, Rfl: 1 .  hydrOXYzine (ATARAX/VISTARIL) 10 MG tablet, Take 1 tablet (10 mg total) by mouth every 8 (eight) hours as needed for anxiety., Disp: 30 tablet, Rfl: 1 .  omeprazole (PRILOSEC) 20 MG capsule, Take 1 capsule (20 mg total) by mouth daily., Disp: 30 capsule, Rfl: 2 .  propranolol ER (INDERAL LA) 80 MG 24 hr capsule, Take 1 capsule (80 mg total) by mouth daily., Disp: 90 capsule, Rfl: 1 .  Vitamin D, Ergocalciferol, (DRISDOL) 1.25 MG (50000 UT) CAPS capsule, Take 1 capsule (50,000 Units total) by mouth every 7 (seven) days. After completion, switch to daily 1000IU OTC., Disp: 12 capsule, Rfl: 0  Physical exam: There were no vitals filed for this visit. Physical Exam   CMP Latest Ref Rng & Units 02/18/2019  Glucose 65 - 99 mg/dL 91  BUN 7 - 25  mg/dL 16  Creatinine 0.60 - 1.35 mg/dL 0.81  Sodium 135 - 146 mmol/L 138  Potassium 3.5 - 5.3 mmol/L 3.8  Chloride 98 - 110 mmol/L 103  CO2 20 - 32 mmol/L 28  Calcium 8.6 - 10.3 mg/dL 9.6  Total Protein 6.1 - 8.1 g/dL 6.5  Total Bilirubin 0.2 - 1.2 mg/dL 1.1  Alkaline Phos 38 - 126 U/L -  AST 10 - 40 U/L 43(H)  ALT 9 - 46 U/L 56(H)   CBC Latest Ref Rng & Units 01/31/2019  WBC  3.8 - 10.8 Thousand/uL 5.5  Hemoglobin 13.2 - 17.1 g/dL 96.2  Hematocrit 83.6 - 50.0 % 44.9  Platelets 140 - 400 Thousand/uL 172    No images are attached to the encounter.  No results found.   Assessment and plan- Patient is a 48 y.o. male who presents to pulmonary nodule clinic for follow-up of incidental lung nodules.  A telephone visit was conducted to review most recent CT scan results.    CT chest without contrast from today *** revealed    Calculating malignancy probability of a pulmonary nodule: Risk factors include: 1.  Age. 2.  Cancer history. 3.  Diameter of pulmonary nodule and mm 4.  Location 5.  Smoking history 6.  Spiculation present   Based on risk factors, this patient is *** risk for the development of lung cancer even in absence of a lung nodule.  I would recommend a ***-month follow-up with imaging to ensure stability given smoking history.  If imaging stable in 3-months, would recommend follow-up with annual low-dose CT screening. Will make referral if appropriate.    During our visit, we discussed pulmonary nodules are a common incidental finding and are often how lung cancer is discovered.  Lung cancer survival is directly related to the stage at diagnosis.  We discussed that nodules can vary in presentation from solitary pulmonary nodules to masses, 2 groundglass opacities and multiple nodules.  Pulmonary nodules in the majority of cases are benign but the probability of these becoming malignant cannot be undermined.  Early identification of malignant nodules could lead to early  diagnosis and increased survival.   We discussed the probability of pulmonary nodules becoming malignant increase with age, pack years of tobacco use, size/characteristics of the nodule and location; with upper lobe involvement being most worrisome.   We discussed the goal of our clinic is to thoroughly evaluate each nodule, developed a comprehensive, individualized plan of care utilizing the most advanced technology and significantly reduce the time from detection to treatment.  A dedicated pulmonary nodule clinic has proven to indeed expedite the detection and treatment of lung cancer.   Patient education in fact sheet provided along with most recent CT scans.   Visit Diagnosis No diagnosis found.  Patient expressed understanding and was in agreement with this plan. He also understands that He can call clinic at any time with any questions, concerns, or complaints.   Greater than 50% was spent in counseling and coordination of care with this patient including but not limited to discussion of the relevant topics above (See A&P) including, but not limited to diagnosis and management of acute and chronic medical conditions.   Thank you for allowing me to participate in the care of this very pleasant patient.    Mauro Kaufmann, NP [facility] Cell - [phone] Pager- [phone] 03/04/2019 11:53 AM

## 2019-03-07 ENCOUNTER — Telehealth: Payer: Self-pay | Admitting: *Deleted

## 2019-03-07 NOTE — Telephone Encounter (Signed)
Called pt to see if was ready to reschedule CT scan and follow up in the lung nodule clinic. Pt did not answer and unable to leave a message. Will try again at a later date.

## 2019-03-14 ENCOUNTER — Telehealth: Payer: Self-pay | Admitting: *Deleted

## 2019-03-14 NOTE — Telephone Encounter (Signed)
Second attempt - called pt to see if was ready to reschedule CT scan and follow up in the lung nodule clinic. Pt did not answer and unable to leave a message. Will try again at a later date.

## 2019-04-01 ENCOUNTER — Ambulatory Visit: Payer: Commercial Managed Care - PPO | Admitting: Family Medicine

## 2019-04-03 ENCOUNTER — Telehealth: Payer: Self-pay | Admitting: *Deleted

## 2019-04-03 NOTE — Telephone Encounter (Signed)
Pt has been made aware of upcoming appts for follow up CT scan and follow up appt with Jennifer Burns, NP in the Lung Nodule Clinic. Pt verbalized understanding. Nothing further needed at this time. 

## 2019-04-28 ENCOUNTER — Ambulatory Visit
Admission: RE | Admit: 2019-04-28 | Discharge: 2019-04-28 | Disposition: A | Discharge status: Home / Self Care | Payer: Commercial Managed Care - PPO | Source: Ambulatory Visit | Attending: Oncology | Admitting: Oncology

## 2019-04-28 ENCOUNTER — Other Ambulatory Visit: Payer: Self-pay

## 2019-04-28 DIAGNOSIS — R911 Solitary pulmonary nodule: Secondary | ICD-10-CM | POA: Diagnosis present

## 2019-04-29 ENCOUNTER — Inpatient Hospital Stay: Payer: Commercial Managed Care - PPO | Attending: Oncology | Admitting: Oncology

## 2019-04-29 DIAGNOSIS — R911 Solitary pulmonary nodule: Secondary | ICD-10-CM | POA: Diagnosis not present

## 2019-04-29 NOTE — Progress Notes (Signed)
Pulmonary Nodule Clinic Consult note [facility]  [phone] [phone]  Patient Care Team: Doren Custard, FNP as PCP - General (Family Medicine)   Name of the patient: Anthony Cardenas  [id number]  [birth year]   Date of visit: 04/29/2019   Diagnosis- Lung Nodule  Virtual Visit via Telephone Note   I connected with Anthony Cardenas on 04/29/19 at @ 11:15am by telephone visit and verified that I am speaking with the correct person using two identifiers.   I discussed the limitations, risks, security and privacy concerns of performing an evaluation and management service by telemedicine and the availability of in-person appointments. I also discussed with the patient that there may be a patient responsible charge related to this service. The patient expressed understanding and agreed to proceed.   Other persons participating in the visit and their role in the encounter:   Patient's location: Home  Provider's location: Home  Chief complaint/ Reason for visit- Pulmonary Nodule Clinic Initial Visit  Past Medical History:  Patient is referred by PCP [name], FNP.  Patient was evaluated back in January 2019 for acute left-sided neck swelling with pain and subjective fevers.  Had CT soft tissue neck revealing a 4 mm right upper lobe nodule.  It was recommended he have a repeat noncontrasted chest CT in approximately 12 months if he was considered high risk.  He is a current everyday smoker.  He smokes approximately 15 to 20 cigarettes daily.  Interval history-patient is here today to review the results of his CT chest completed yesterday.  Per medical chart review, he has a past medical history: Past Medical History:  Diagnosis Date  . Dermatitis   . Hypertension   . Migraines   . Vitamin D deficiency disease    9.1 in Feb 2016    He has a past surgical history positive for: Past Surgical History:  Procedure Laterality Date  . DENTAL  SURGERY  2011   In the interim, patient has been doing well.  He denies any new concerns.  He is currently being treated for mild COPD by his PCP.  He has noted some intermittent coughing as well as dyspnea with exertion.  Otherwise he is doing great.  He works full-time as a International aid/development worker at [employer].  He denies any personal history of cancer.  States his mother died of lung cancer in her mid 2s.  He also believes his paternal grandfather died of lung cancer.  He denies any known occupational exposure to toxins known to cause cancer.  He is a current everyday smoker and smokes approximately 15 to 20 cigarettes daily.  He states approximately 3 to 4 months ago he was down to 3 cigarettes daily but unfortunately has increased due to stress in his personal life.  He is attempting to cut back again.  Patient is not married.  He lives alone.  ECOG FS:1 - Symptomatic but completely ambulatory  Review of systems- Review of Systems  Constitutional: Negative.  Negative for chills, fever, malaise/fatigue and weight loss.  HENT: Negative for congestion, ear pain and tinnitus.   Eyes: Negative.  Negative for blurred vision and double vision.  Respiratory: Positive for cough and shortness of breath (With exertion). Negative for sputum production.   Cardiovascular: Negative.  Negative for chest pain, palpitations and leg swelling.  Gastrointestinal: Negative.  Negative for abdominal pain, constipation, diarrhea, nausea and vomiting.  Genitourinary: Negative for dysuria, frequency and urgency.  Musculoskeletal: Negative for back pain  and falls.  Skin: Negative.  Negative for rash.  Neurological: Negative.  Negative for weakness and headaches.  Endo/Heme/Allergies: Negative.  Does not bruise/bleed easily.  Psychiatric/Behavioral: Negative.  Negative for depression. The patient is not nervous/anxious and does not have insomnia.      Allergies  Allergen Reactions  . Chantix [Varenicline] Other  (See Comments)    Bad dreams, anxiety     Past Medical History:  Diagnosis Date  . Dermatitis   . Hypertension   . Migraines   . Vitamin D deficiency disease    9.1 in Feb 2016     Past Surgical History:  Procedure Laterality Date  . DENTAL SURGERY  2011    Social History   Socioeconomic History  . Marital status: Married    Spouse name: Not on file  . Number of children: Not on file  . Years of education: Not on file  . Highest education level: Not on file  Occupational History    Employer: [employer]  Tobacco Use  . Smoking status: Current Every Day Smoker    Packs/day: 1.00    Types: Cigarettes  . Smokeless tobacco: Never Used  Substance and Sexual Activity  . Alcohol use: Yes    Comment: occasionally  . Drug use: No  . Sexual activity: Yes  Other Topics Concern  . Not on file  Social History Narrative  . Not on file   Social Determinants of Health   Financial Resource Strain:   . Difficulty of Paying Living Expenses: Not on file  Food Insecurity:   . Worried About Programme researcher, broadcasting/film/video in the Last Year: Not on file  . Ran Out of Food in the Last Year: Not on file  Transportation Needs:   . Lack of Transportation (Medical): Not on file  . Lack of Transportation (Non-Medical): Not on file  Physical Activity:   . Days of Exercise per Week: Not on file  . Minutes of Exercise per Session: Not on file  Stress:   . Feeling of Stress : Not on file  Social Connections:   . Frequency of Communication with Friends and Family: Not on file  . Frequency of Social Gatherings with Friends and Family: Not on file  . Attends Religious Services: Not on file  . Active Member of Clubs or Organizations: Not on file  . Attends Banker Meetings: Not on file  . Marital Status: Not on file  Intimate Partner Violence:   . Fear of Current or Ex-Partner: Not on file  . Emotionally Abused: Not on file  . Physically Abused: Not on file  . Sexually Abused: Not  on file    Family History  Problem Relation Age of Onset  . Cancer Mother        lung  . COPD Mother   . Asthma Mother   . Hypertension Mother   . Cancer Paternal Grandfather        lung  . Heart disease Maternal Grandmother   . Hypertension Maternal Grandmother   . Stroke Maternal Grandmother   . Diabetes Neg Hx      Current Outpatient Medications:  .  albuterol (PROVENTIL HFA;VENTOLIN HFA) 108 (90 Base) MCG/ACT inhaler, Inhale 2 puffs into the lungs every 4 (four) hours as needed for wheezing or shortness of breath., Disp: 1 Inhaler, Rfl: 1 .  amLODipine (NORVASC) 10 MG tablet, Take 1 tablet (10 mg total) by mouth daily., Disp: 90 tablet, Rfl: 1 .  buPROPion (WELLBUTRIN XL)  150 MG 24 hr tablet, Take 1 tablet (150 mg total) by mouth daily., Disp: 90 tablet, Rfl: 1 .  cholecalciferol (VITAMIN D) 1000 units tablet, Take 1,000 Units by mouth daily., Disp: , Rfl:  .  escitalopram (LEXAPRO) 10 MG tablet, Take 1/2 tablet once daily for 7 days, then increase to 1 tablet once daily., Disp: 30 tablet, Rfl: 1 .  hydrOXYzine (ATARAX/VISTARIL) 10 MG tablet, Take 1 tablet (10 mg total) by mouth every 8 (eight) hours as needed for anxiety., Disp: 30 tablet, Rfl: 1 .  omeprazole (PRILOSEC) 20 MG capsule, Take 1 capsule (20 mg total) by mouth daily., Disp: 30 capsule, Rfl: 2 .  propranolol ER (INDERAL LA) 80 MG 24 hr capsule, Take 1 capsule (80 mg total) by mouth daily., Disp: 90 capsule, Rfl: 1 .  Vitamin D, Ergocalciferol, (DRISDOL) 1.25 MG (50000 UT) CAPS capsule, Take 1 capsule (50,000 Units total) by mouth every 7 (seven) days. After completion, switch to daily 1000IU OTC., Disp: 12 capsule, Rfl: 0  Physical exam: There were no vitals filed for this visit.   CMP Latest Ref Rng & Units 02/18/2019  Glucose 65 - 99 mg/dL 91  BUN 7 - 25 mg/dL 16  Creatinine 0.60 - 1.35 mg/dL 0.81  Sodium 135 - 146 mmol/L 138  Potassium 3.5 - 5.3 mmol/L 3.8  Chloride 98 - 110 mmol/L 103  CO2 20 - 32 mmol/L  28  Calcium 8.6 - 10.3 mg/dL 9.6  Total Protein 6.1 - 8.1 g/dL 6.5  Total Bilirubin 0.2 - 1.2 mg/dL 1.1  Alkaline Phos 38 - 126 U/L -  AST 10 - 40 U/L 43(H)  ALT 9 - 46 U/L 56(H)   CBC Latest Ref Rng & Units 01/31/2019  WBC 3.8 - 10.8 Thousand/uL 5.5  Hemoglobin 13.2 - 17.1 g/dL 15.8  Hematocrit 38.5 - 50.0 % 44.9  Platelets 140 - 400 Thousand/uL 172    No images are attached to the encounter.  CT Chest Wo Contrast  Result Date: 04/28/2019 CLINICAL DATA:  Pulmonary nodule on CT neck 05/16/2017. Current smoker. EXAM: CT CHEST WITHOUT CONTRAST TECHNIQUE: Multidetector CT imaging of the chest was performed following the standard protocol without IV contrast. COMPARISON:  CT neck 05/16/2017. FINDINGS: Cardiovascular: Incidental note is made of calcification of the ligamentum arteriosum. Vascular structures are unremarkable. Heart size normal. No pericardial effusion. Mediastinum/Nodes: No pathologically enlarged mediastinal or axillary lymph nodes. Hilar regions are difficult to evaluate without IV contrast but appear grossly unremarkable. Esophagus is grossly unremarkable. Lungs/Pleura: Centrilobular and paraseptal emphysema. Smoking related respiratory bronchiolitis. Previously seen 3 mm right upper lobe nodule (4/43) is unchanged and considered benign. No pleural fluid. Airway is unremarkable. Upper Abdomen: Visualized portions of the liver, gallbladder, adrenal glands and right kidney are unremarkable. 3.2 cm low-density lesion in the left kidney is incompletely imaged. Visualized portions of the spleen, pancreas, stomach and bowel are unremarkable with exception of a tiny hiatal hernia. No upper abdominal adenopathy. Musculoskeletal: Old right seventh posterolateral rib fracture no worrisome lytic or sclerotic lesions. IMPRESSION: 1. No worrisome pulmonary nodules. 2.  Emphysema (ICD10-J43.9). Electronically Signed   By: Lorin Picket M.D.   On: 04/28/2019 10:17   Assessment and plan- Patient  is a 49 y.o. male who presents to pulmonary nodule clinic for follow-up of incidental lung nodules.  A telephone visit was conducted to review most recent CT scan results.    CT chest without contrast from 04/28/2019 revealed emphysema, smoking-related respiratory bronchiolitis and a stable 3 mm  right upper lobe nodule and considered benign.   Calculating malignancy probability of a pulmonary nodule: Risk factors include: 1.  Age. 2.  Cancer history. 3.  Diameter of pulmonary nodule and mm 4.  Location 5.  Smoking history 6.  Spiculation present   Based on risk factors, this patient is high risk for the development of lung cancer.  He continues to smoke but is attempting to cut back.  He also has family history of lung cancer.  He does not need any additional follow-up in our clinic as the nodule has been stable for greater then 18 months.  I would recommend enrollment into our low-dose CT screening program when he meets criteria.  Patient has to be between the ages of 16 and 26.   During our visit, we discussed pulmonary nodules are a common incidental finding and are often how lung cancer is discovered.  Lung cancer survival is directly related to the stage at diagnosis.  We discussed that nodules can vary in presentation from solitary pulmonary nodules to masses, 2 groundglass opacities and multiple nodules.  Pulmonary nodules in the majority of cases are benign but the probability of these becoming malignant cannot be undermined.  Early identification of malignant nodules could lead to early diagnosis and increased survival.   We discussed the probability of pulmonary nodules becoming malignant increase with age, pack years of tobacco use, size/characteristics of the nodule and location; with upper lobe involvement being most worrisome.   We discussed the goal of our clinic is to thoroughly evaluate each nodule, developed a comprehensive, individualized plan of care utilizing the most advanced  technology and significantly reduce the time from detection to treatment.  A dedicated pulmonary nodule clinic has proven to indeed expedite the detection and treatment of lung cancer.   Patient education in fact sheet provided along with most recent CT scans.  Plan: Discussed results of CT chest from yesterday.  All questions answered. No additional follow-up needed in our clinic.  Disposition: No additional follow-up needed. Referral to low-dose CT screening program when he meets criteria.   Visit Diagnosis 1. Pulmonary nodule, right     Patient expressed understanding and was in agreement with this plan. He also understands that He can call clinic at any time with any questions, concerns, or complaints.   Greater than 50% was spent in counseling and coordination of care with this patient including but not limited to discussion of the relevant topics above (See A&P) including, but not limited to diagnosis and management of acute and chronic medical conditions.   Thank you for allowing me to participate in the care of this very pleasant patient.    Mauro Kaufmann, NP [facility] Cell - [phone] Pager- [phone] 04/29/2019 11:44 AM

## 2019-08-25 ENCOUNTER — Other Ambulatory Visit: Payer: Self-pay | Admitting: Family Medicine

## 2019-08-25 DIAGNOSIS — F419 Anxiety disorder, unspecified: Secondary | ICD-10-CM

## 2019-08-25 DIAGNOSIS — I10 Essential (primary) hypertension: Secondary | ICD-10-CM

## 2019-08-25 MED ORDER — PROPRANOLOL HCL ER 80 MG PO CP24: 80.0000 mg | ORAL_CAPSULE | Freq: Every day | ORAL | 0 refills | Status: DC

## 2019-08-25 MED ORDER — AMLODIPINE BESYLATE 10 MG PO TABS: 10.0000 mg | ORAL_TABLET | Freq: Every day | ORAL | 0 refills | Status: DC

## 2019-08-25 NOTE — Telephone Encounter (Signed)
Requested Prescriptions  Pending Prescriptions Disp Refills  . amLODipine (NORVASC) 10 MG tablet 90 tablet 0    Sig: Take 1 tablet (10 mg total) by mouth daily.     Cardiovascular:  Calcium Channel Blockers Failed - 08/25/2019 12:02 PM      Failed - Last BP in normal range    BP Readings from Last 1 Encounters:  02/18/19 (!) 142/86         Failed - Valid encounter within last 6 months    Recent Outpatient Visits          6 months ago Essential hypertension   [facility] Doren Custard, FNP   6 months ago Pulmonary emphysema, unspecified emphysema type [facility])   [facility] Doren Custard, FNP   1 year ago Essential hypertension   [facility] Lada, Janit Bern, MD   2 years ago Lymphadenitis   [facility] Doren Custard, FNP   3 years ago Vitamin D deficiency disease   [facility] Lada, Janit Bern, MD      Future Appointments            In 3 weeks Doren Custard, FNP [facility]           . propranolol ER (INDERAL LA) 80 MG 24 hr capsule 90 capsule 0    Sig: Take 1 capsule (80 mg total) by mouth daily.     Cardiovascular:  Beta Blockers Failed - 08/25/2019 12:02 PM      Failed - Last BP in normal range    BP Readings from Last 1 Encounters:  02/18/19 (!) 142/86         Failed - Valid encounter within last 6 months    Recent Outpatient Visits          6 months ago Essential hypertension   [facility] Doren Custard, FNP   6 months ago Pulmonary emphysema, unspecified emphysema type [facility])   [facility] Doren Custard, FNP   1 year ago Essential hypertension   [facility] Lada, Janit Bern, MD   2 years ago Lymphadenitis   [facility] Doren Custard, FNP   3 years ago Vitamin D deficiency disease   [facility] Lada, Janit Bern, MD      Future  Appointments            In 3 weeks Doren Custard, FNP [facility]           Passed - Last Heart Rate in normal range    Pulse Readings from Last 1 Encounters:  02/18/19 80

## 2019-08-25 NOTE — Telephone Encounter (Signed)
Patient requesting amLODipine (NORVASC) 10 MG tablet and propranolol ER (INDERAL LA) 80 MG 24 hr capsule to hold him over until transfer of care appointment.   [facility] - [place], [place] - [address] Phone:  [phone]  Fax:  [phone]

## 2019-09-16 ENCOUNTER — Ambulatory Visit: Payer: Commercial Managed Care - PPO | Admitting: Family Medicine

## 2019-10-08 IMAGING — US US BIOPSY LYMPH NODE
1 series · 13 of 15 positions shown · non-contrast
Comparison: none

INDICATION: 46-year-old with history of left neck swelling. The left neck
swelling decreased with antibiotics and steroids but the patient
continues to have fullness in this area. Request for
ultrasound-guided biopsy. Prior neck CT 05/16/2017 raised concern
for suppurative adenitis.

[Series 1: us biopsy lymph node · 0.04mm/px · 13 of 15 slices shown]
[im 1/15]
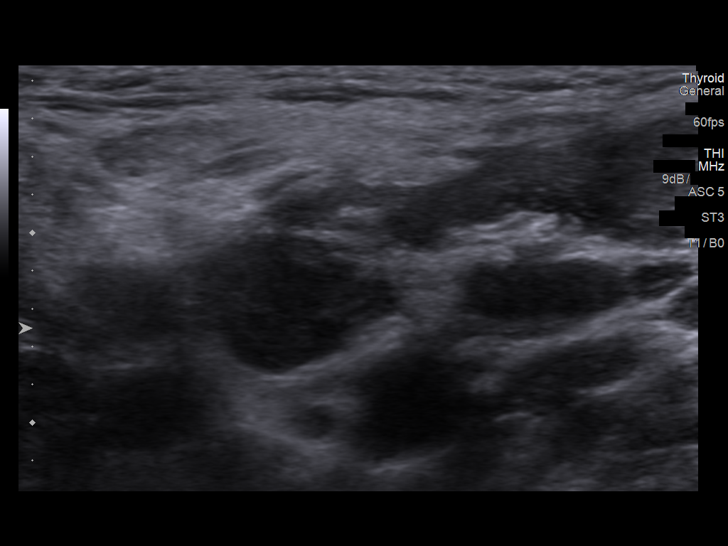
[im 2/15]
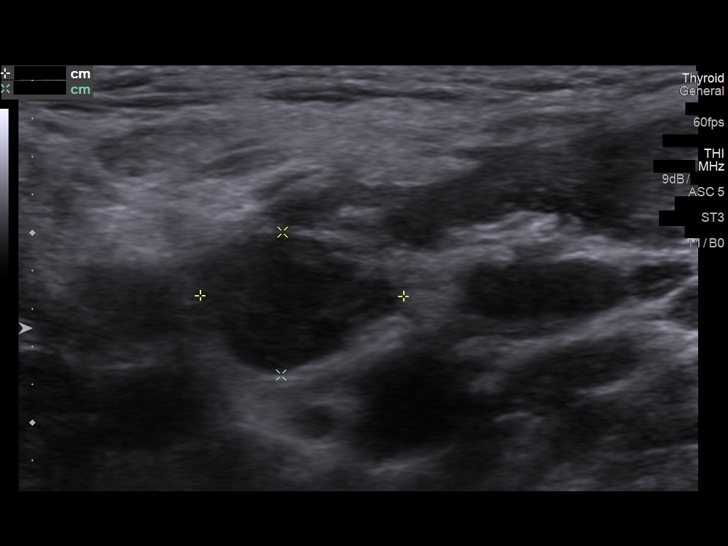
[im 3/15]
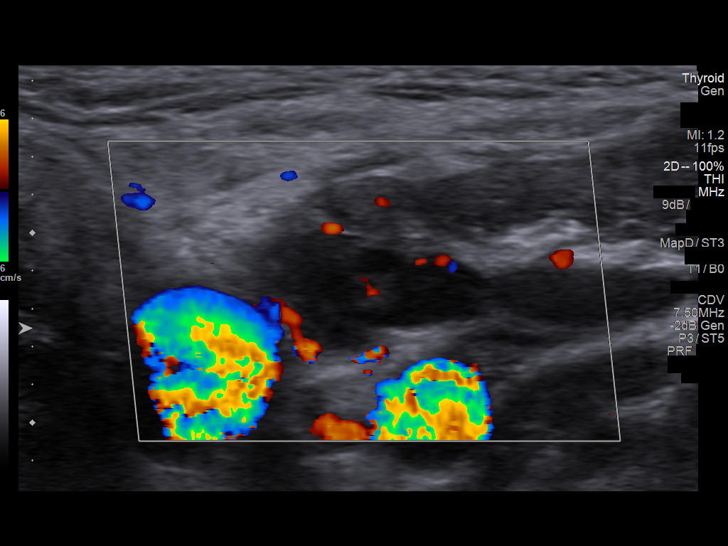
[im 5/15]
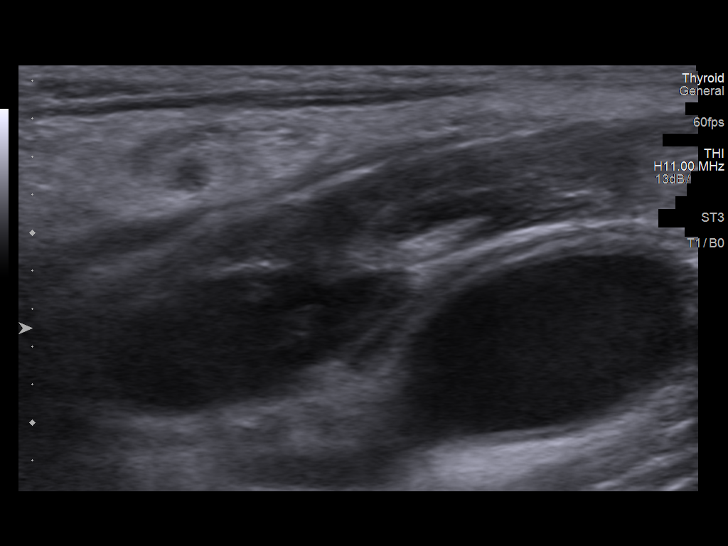
[im 6/15]
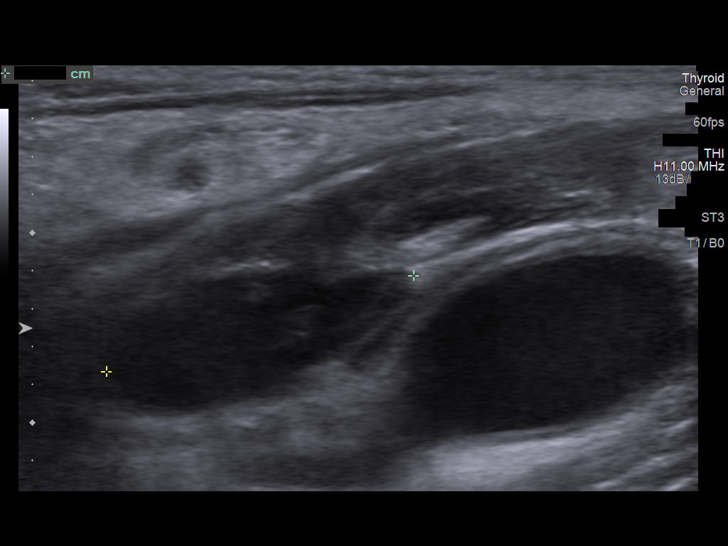
[im 7/15]
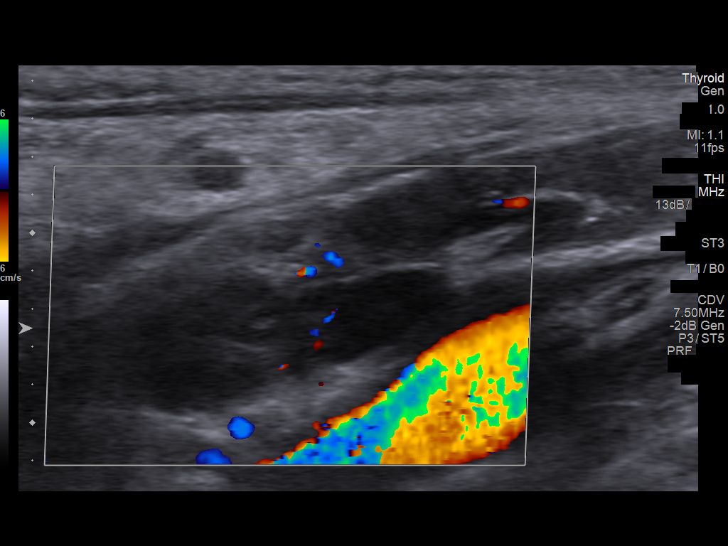
[im 8/15]
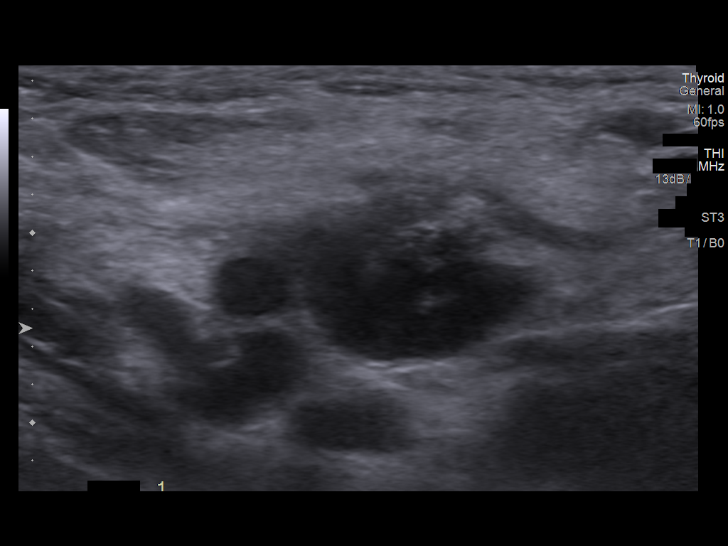
[im 9/15]
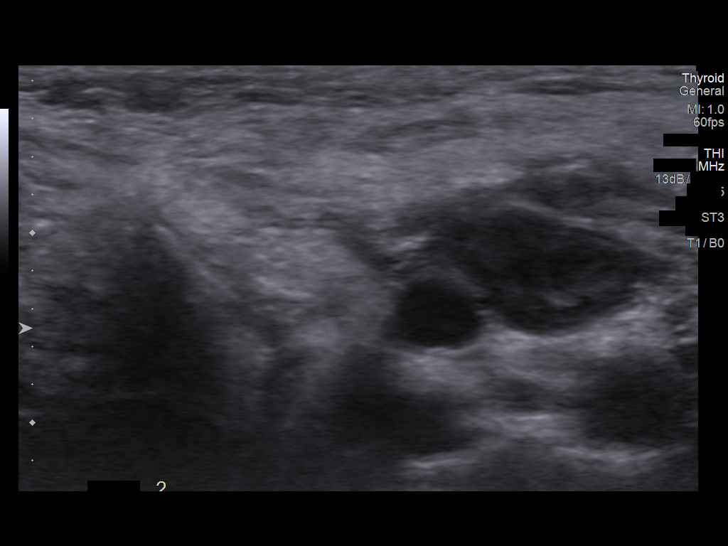
[im 10/15]
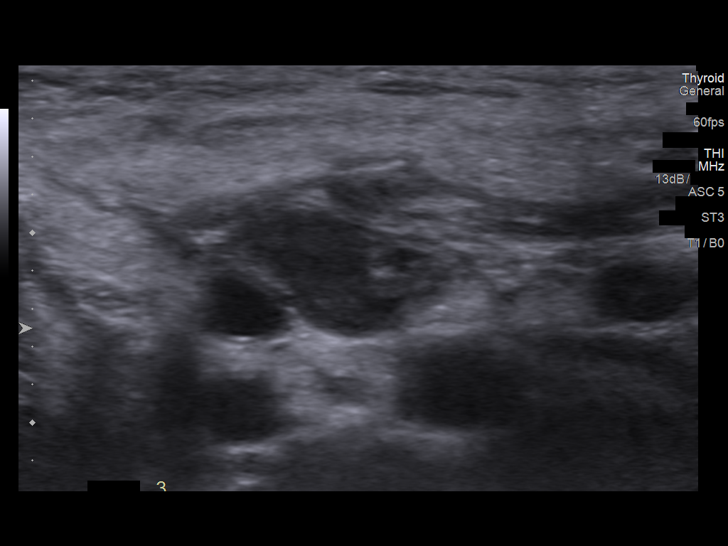
[im 11/15]
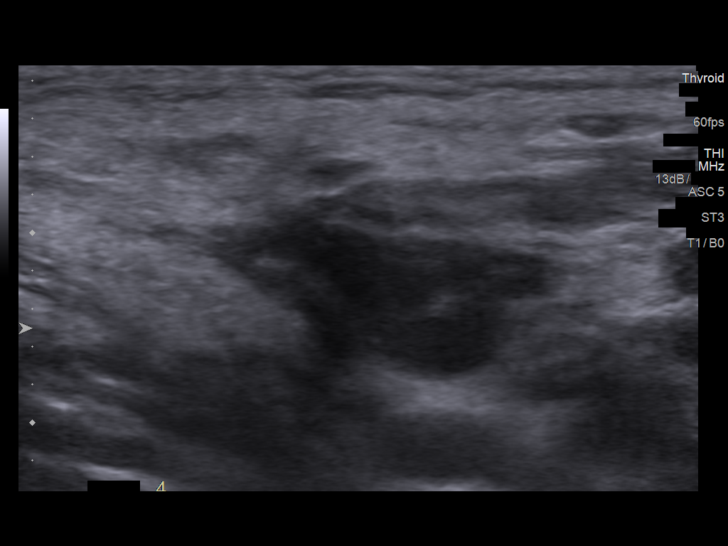
[im 13/15]
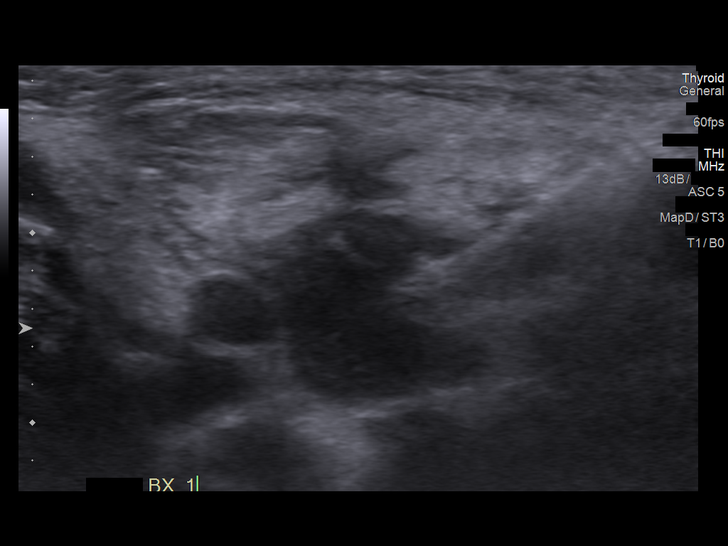
[im 14/15]
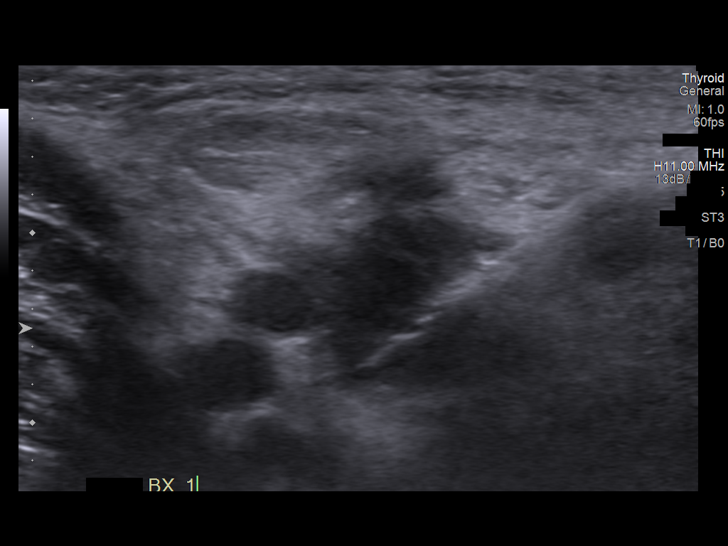
[im 15/15]
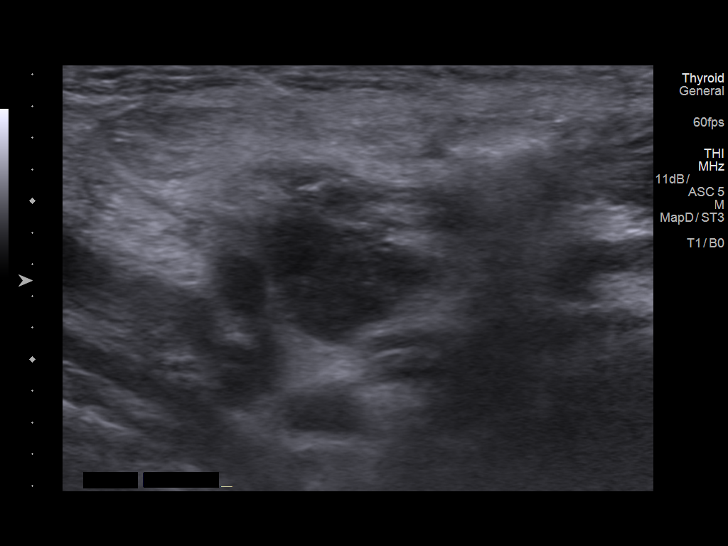

[13 of 15 positions shown; findings below may reference images not displayed]

EXAM:
ULTRASOUND-GUIDED BIOPSY OF LEFT CERVICAL LYMPH NODE

MEDICATIONS:
None.

ANESTHESIA/SEDATION:
Moderate (conscious) sedation was employed during this procedure. A
total of Versed 3.0 mg and Fentanyl 75 mcg was administered
intravenously.

Moderate Sedation Time: 31 minutes. The patient's level of
consciousness and vital signs were monitored continuously by
radiology nursing throughout the procedure under my direct
supervision.

FLUOROSCOPY TIME:  None

COMPLICATIONS:
None immediate.

PROCEDURE:
Informed written consent was obtained from the patient after a
thorough discussion of the procedural risks, benefits and
alternatives. All questions were addressed. A timeout was performed
prior to the initiation of the procedure.

Left side of the neck was imaged with ultrasound. The area of
concern is in the left submandibular region. Multiple small lymph
nodes were identified. Irregular hypoechoic lesion or lymph node at
the area of concern also corresponds with the abnormal tissue in the
previous CT. This area was targeted for biopsy. The left side of the
neck was prepped with chlorhexidine and a sterile field was created.
Skin was anesthetized with 1% lidocaine. Using ultrasound guidance,
5 fine-needle aspirations were obtained with 25 gauge needles. A
small incision was made laterally and an 18 gauge core needle was
directed into this structure with difficulty. Procedure was
technically difficult due to the large number of surrounding veins
and patient discomfort despite moderate sedation. Core biopsy needle
was successfully advanced into this lesion but no tissue was
identified within the needle. Therefore, 3 additional fine-needle
aspirations were obtained with 25 gauge needles. Bandage placed over
the puncture site.
FINDINGS: Multiple small lymph nodes throughout the neck. The most abnormal
lymph node at the area of concern is deep in the left submandibular
region and this structure roughly measures 1.1 x 0.8 cm. This
structure appears to correspond with the necrotic lymph node on the
previous neck CT.

Fine-needle aspiration needles and core biopsy needle were
identified within the abnormal lymph node. No significant bleeding
or hematoma formation following the biopsy.
IMPRESSION: Ultrasound-guided biopsy of an abnormal lymph node in the left
submandibular region. Total of 8 fine-needle aspirations were
obtained. One core biopsy was performed but no significant core
material was collected.

## 2019-12-30 ENCOUNTER — Other Ambulatory Visit: Payer: Self-pay | Admitting: Family Medicine

## 2019-12-30 ENCOUNTER — Encounter: Payer: Self-pay | Admitting: Internal Medicine

## 2019-12-30 ENCOUNTER — Ambulatory Visit (INDEPENDENT_AMBULATORY_CARE_PROVIDER_SITE_OTHER): Payer: Commercial Managed Care - PPO | Admitting: Internal Medicine

## 2019-12-30 ENCOUNTER — Other Ambulatory Visit: Payer: Self-pay

## 2019-12-30 DIAGNOSIS — I10 Essential (primary) hypertension: Secondary | ICD-10-CM

## 2019-12-30 DIAGNOSIS — F17200 Nicotine dependence, unspecified, uncomplicated: Secondary | ICD-10-CM

## 2019-12-30 DIAGNOSIS — F419 Anxiety disorder, unspecified: Secondary | ICD-10-CM

## 2019-12-30 DIAGNOSIS — U071 COVID-19: Secondary | ICD-10-CM | POA: Diagnosis not present

## 2019-12-30 MED ORDER — ALBUTEROL SULFATE HFA 108 (90 BASE) MCG/ACT IN AERS
2.0000 | INHALATION_SPRAY | RESPIRATORY_TRACT | 3 refills | Status: AC | PRN
Start: 2019-12-30 — End: ?

## 2019-12-30 MED ORDER — PROPRANOLOL HCL ER 80 MG PO CP24: 80.0000 mg | ORAL_CAPSULE | Freq: Every day | ORAL | 0 refills | Status: DC

## 2019-12-30 MED ORDER — AMLODIPINE BESYLATE 10 MG PO TABS: 10.0000 mg | ORAL_TABLET | Freq: Every day | ORAL | 0 refills | Status: DC

## 2019-12-30 NOTE — Progress Notes (Signed)
Name: Anthony Cardenas.   MRN: [id number]    DOB: 02-08-1971   Date:12/30/2019       Progress Note  Subjective  Chief Complaint  Chief Complaint  Patient presents with  . Covid Positive    Diagnosed Wednesday 12/24/19  . Cough    chest congestion  . Sinus Problem    bad sinus pressure, no smell, no taste    I connected with  Anthony Cardenas. on 12/30/19 at  1:20 PM EDT by telephone and verified that I am speaking with the correct person using two identifiers.  I discussed the limitations, risks, security and privacy concerns of performing an evaluation and management service by telephone and the availability of in person appointments. The patient expressed understanding and agreed to proceed. Staff also discussed with the patient that there may be a patient responsible charge related to this service. Patient Location: Home Provider Location: [facility] Additional Individuals present: none  HPI   Patient is a 49 year old male former patient of [name]  Follows up today after a Covid positive test result  Test result was positive on 12/24/2019 when seen in urgent care,  Sx's started two days prior  Had bronchitis about a month ago (end of July), and had prednisone course to treat.  Notes some symptoms that persist include: + cough with chest congestion, + production, sometimes discolored, no blood No marked SOB except when coughing bad No doc'ed fever, feeling feverish at times + mild sore throat  +  sinus pressure, not blowing nose a lot - is clear when does + loss of smell, loss of taste Minimal N/ no V + muscle aches, back and better now No marked loose stools, some diarrhea and better last couple days No CP, passing out episodes  Trying mucinex, robitussin, cough drops, tylenol, daytime cold/flu porduct Tobacco-current every day smoker, lessened since Wed Comorbid conditions reviewed  + COPD No h/o DM, heart disease, CKD,  No morbid obesity Positive hypertension,  with his blood pressure very high when seen at the urgent care 9/1.  Was 164/116 He notes he has been out of his blood pressure medicines, and needs them refilled.  He has not had a follow-up here in the recent past. Not back at work as of yet   Patient Active Problem List   Diagnosis Date Noted  . Thrombocytopenia (HCC) 01/31/2019  . Overweight (BMI 25.0-29.9) 01/31/2019  . GERD (gastroesophageal reflux disease) 01/15/2018  . Emphysema, unspecified (HCC) 05/29/2017  . Pulmonary nodule, right 05/29/2017  . Lymphadenitis 05/16/2017  . Chronic pain of right ankle 03/14/2016  . Anxiety 09/12/2015  . Medication monitoring encounter 05/18/2015  . Tobacco abuse 04/11/2015  . Hypertension   . Vitamin D deficiency disease   . Dermatitis     Past Surgical History:  Procedure Laterality Date  . DENTAL SURGERY  2011    Family History  Problem Relation Age of Onset  . Cancer Mother        lung  . COPD Mother   . Asthma Mother   . Hypertension Mother   . Cancer Paternal Grandfather        lung  . Heart disease Maternal Grandmother   . Hypertension Maternal Grandmother   . Stroke Maternal Grandmother   . Diabetes Neg Hx     Social History   Tobacco Use  . Smoking status: Current Every Day Smoker    Packs/day: 1.00    Types: Cigarettes  . Smokeless tobacco:  Never Used  Substance Use Topics  . Alcohol use: Yes    Comment: occasionally     Current Outpatient Medications:  .  albuterol (PROVENTIL HFA;VENTOLIN HFA) 108 (90 Base) MCG/ACT inhaler, Inhale 2 puffs into the lungs every 4 (four) hours as needed for wheezing or shortness of breath., Disp: 1 Inhaler, Rfl: 1 .  amLODipine (NORVASC) 10 MG tablet, Take 1 tablet (10 mg total) by mouth daily., Disp: 90 tablet, Rfl: 0 .  omeprazole (PRILOSEC) 20 MG capsule, Take 1 capsule (20 mg total) by mouth daily., Disp: 30 capsule, Rfl: 2 .  propranolol ER (INDERAL LA) 80 MG 24 hr capsule, Take 1 capsule (80 mg total) by mouth daily.,  Disp: 90 capsule, Rfl: 0  Allergies  Allergen Reactions  . Chantix [Varenicline] Other (See Comments)    Bad dreams, anxiety    With staff assistance, above reviewed with the patient today.  ROS: As per HPI, otherwise no specific complaints on a limited and focused system review   Objective  Virtual encounter, vitals not obtained.  There is no height or weight on file to calculate BMI.  Physical Exam   Appears in NAD via conversation, one coughing outbreak on our phone conversation noted Breathing: No obvious respiratory distress. Speaking in complete sentences Neurological: Pt is alert, Speech is normal Psychiatric: Patient has a normal mood and affect. Judgment and thought content normal.   No results found for this or any previous visit (from the past 72 hour(s)).  PHQ2/9: Depression screen [facility] 2/9 12/30/2019 02/18/2019 01/31/2019 01/15/2018 05/29/2017  Decreased Interest 0 0 0 0 0  Down, Depressed, Hopeless 0 0 0 0 0  PHQ - 2 Score 0 0 0 0 0  Altered sleeping - 0 0 0 -  Tired, decreased energy - 0 0 0 -  Change in appetite - 0 0 0 -  Feeling bad or failure about yourself  - 0 0 0 -  Trouble concentrating - 0 0 0 -  Moving slowly or fidgety/restless - 0 0 0 -  Suicidal thoughts - 0 0 0 -  PHQ-9 Score - 0 0 0 -  Difficult doing work/chores - Not difficult at all Not difficult at all - -   PHQ-2/9 Result reviewed  Fall Risk: Fall Risk  12/30/2019 02/18/2019 01/31/2019 01/15/2018 05/29/2017  Falls in the past year? 0 0 0 Yes No  Number falls in past yr: 0 0 0 1 -  Injury with Fall? 0 0 0 No -  Follow up Falls evaluation completed Falls evaluation completed Falls evaluation completed - -     Assessment & Plan 1. COVID-19  Educated on Covid and management recommendations, Recommend continued treatment and symptomatic measures - stay well hydrated, rest, continuing a mucinex or robitussin product OTC,  tylenol prn for any low grade temps/fevers/aches Recommended a Flonase  product for his sinus symptoms, can use twice daily for the first 3 days, then use once daily Refilled his albuterol inhaler to use more routinely in the short-term, taking 3 times a day routinely for 3 to 5 days, before returning to as needed once improving. Discussed the risk/benefits of another steroid course, and agreed to try adding the albuterol inhaler first to help.  Continuing to remain isolated presently until recovered and RTW discussed - recommended ok to return if at least 10 days since first symptoms, no fever at least 24-48 hours since recovery and other sx's have much improved.  He was told he could return Thursday  or Friday at the earliest, and only if his symptoms were much improved.  If sx's worsening acutely, such as increased SOB, fevers, or other concerning sx's arise, should f/u and may need to be seen more emergently in the ER for more immediate evaluation, and he was understanding of this.  2. Tobacco dependence/COPD  As above  3. HTN Concern with his high blood pressure noted, and do feel he needs to get back on his medications. Also emphasized the importance of a follow-up visit once he has significantly improved from his Covid illness. Okay to refill the medications today, and should be able to follow-up within the next 4 weeks for recheck of his blood pressures and his status taking the medications again.  I discussed the assessment and treatment plan with the patient. The patient was provided an opportunity to ask questions and all were answered. The patient agreed with the plan and demonstrated an understanding of the instructions.  Red flags and when to present for emergency care or RTC including fevers, chest pain, shortness of breath, new/worsening/un-resolving symptoms reviewed with patient at time of visit.   The patient was advised to call back or seek an in-person evaluation if the symptoms worsen or if the condition fails to improve as anticipated.  I  provided  20 minutes of non-face-to-face time during this encounter that included discussing at length patient's sx/history, pertinent pmhx, medications, treatment and follow up plan. This time also included the necessary documentation, orders, and chart review.  Jamelle Haring, MD

## 2019-12-30 NOTE — Telephone Encounter (Signed)
RX refill propranolol ER (INDERAL LA) 80 MG 24 hr capsule  [facility] - [place], [place] - [address] Phone:  [phone]  Fax:  [phone]

## 2020-02-12 IMAGING — CT CT CHEST W/O CM
2 of 4 series · 15 of 36 positions shown, 18 images · non-contrast
Comparison: CT neck 05/16/2017.

CLINICAL DATA: Pulmonary nodule on CT neck 05/16/2017. Current
smoker.

EXAM:
CT CHEST WITHOUT CONTRAST
TECHNIQUE: Multidetector CT imaging of the chest was performed following the
standard protocol without IV contrast.

[Series 3: chest 2.00 · axial · 0.67mm/px · z∈[-1276,-954]mm · 12 of 191 slices shown, 15 images]
[im 15/191  mediastinal]
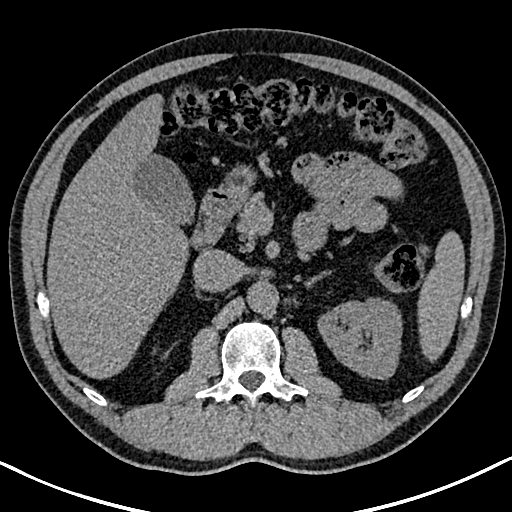
[im 15/191  lung]
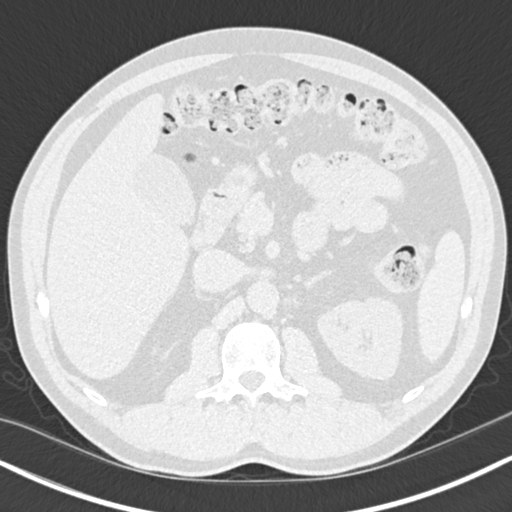
[im 30/191  lung]
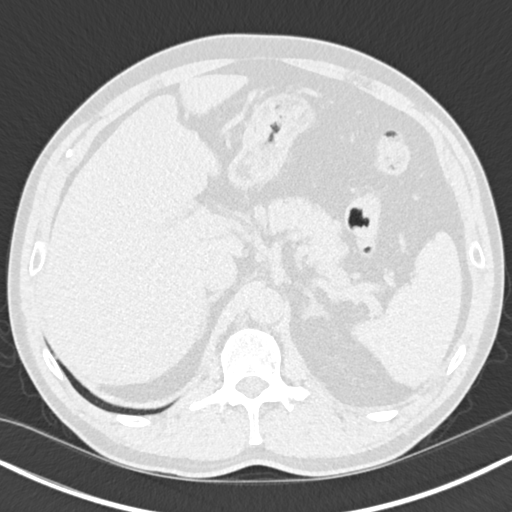
[im 44/191  lung]
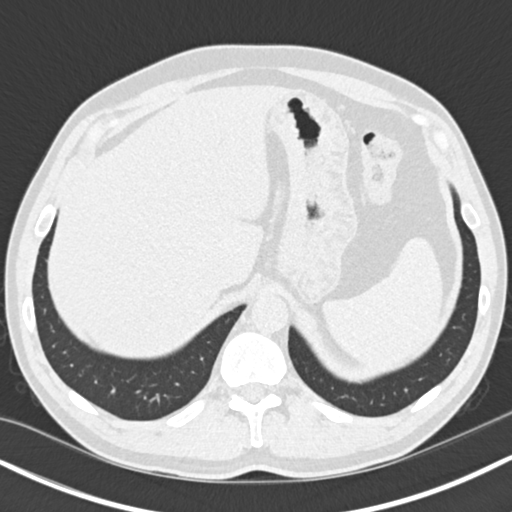
[im 59/191  lung]
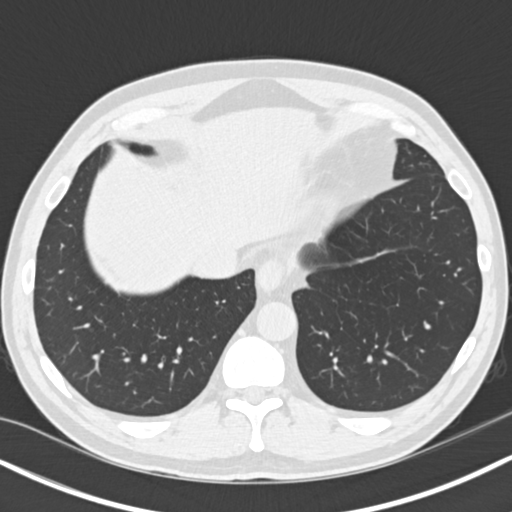
[im 74/191  mediastinal]
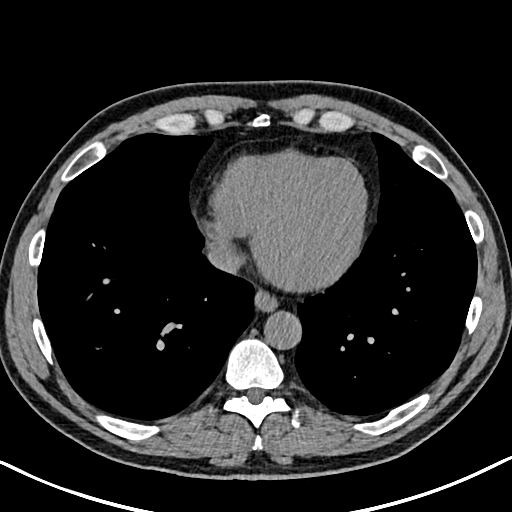
[im 74/191  lung]
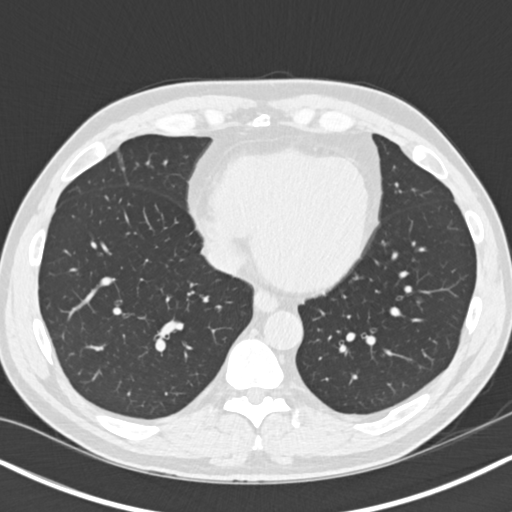
[im 88/191  lung]
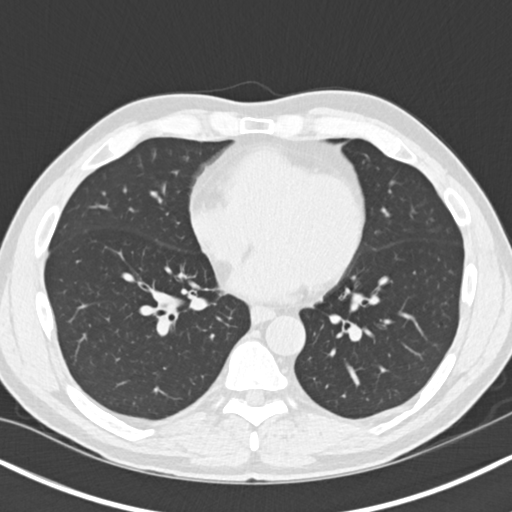
[im 103/191  lung]
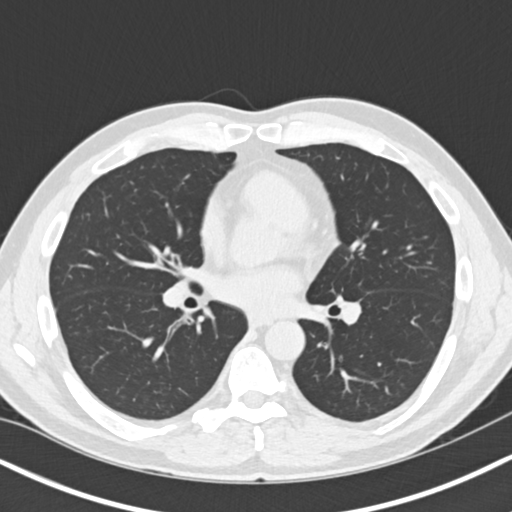
[im 117/191  lung]
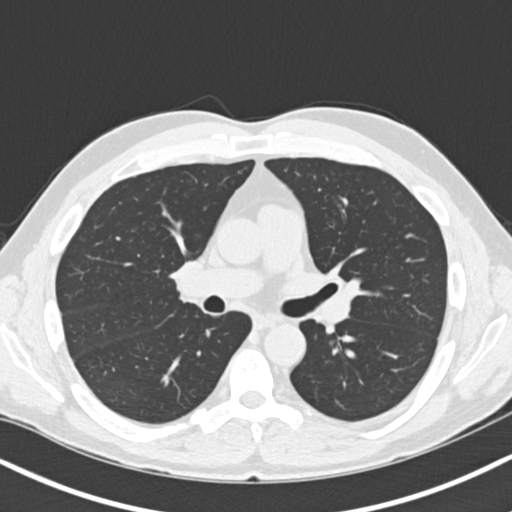
[im 132/191  mediastinal]
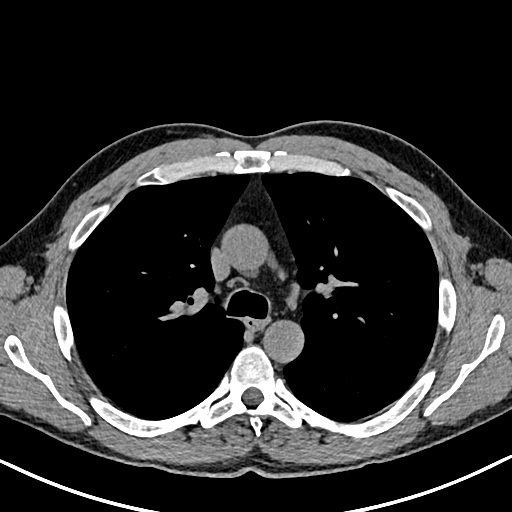
[im 132/191  lung]
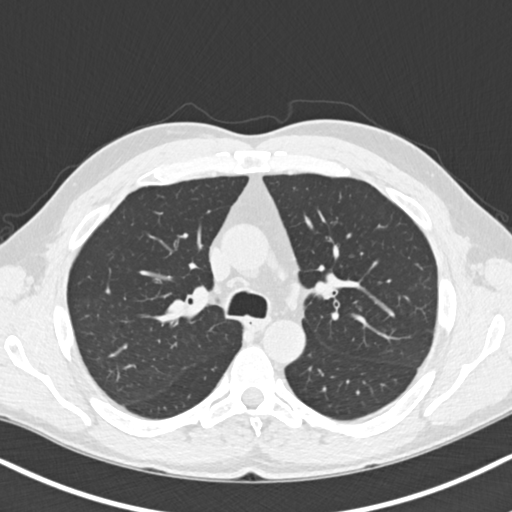
[im 147/191  lung]
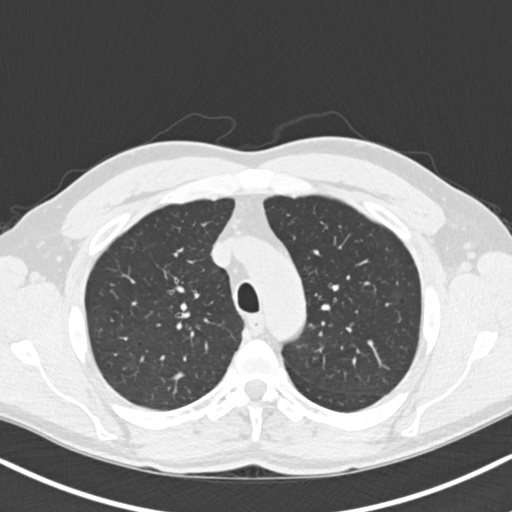
[im 161/191  lung]
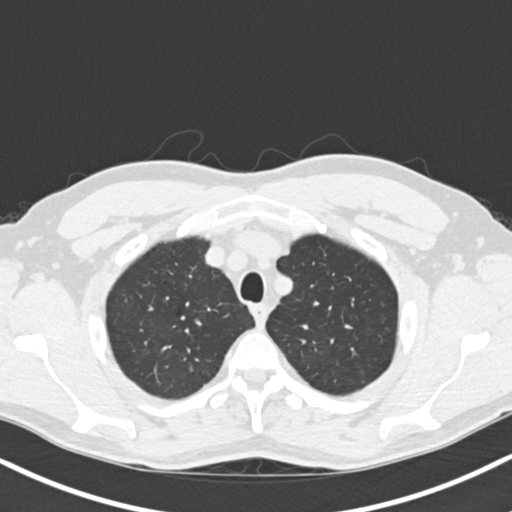
[im 176/191  lung]
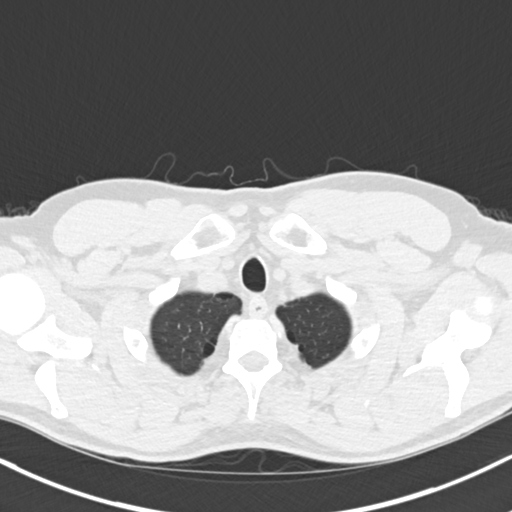

[Series 6: coronals chest 2.00 cor · coronal · 0.67mm/px · 3 of 152 slices shown]
[im 31/152  lung]
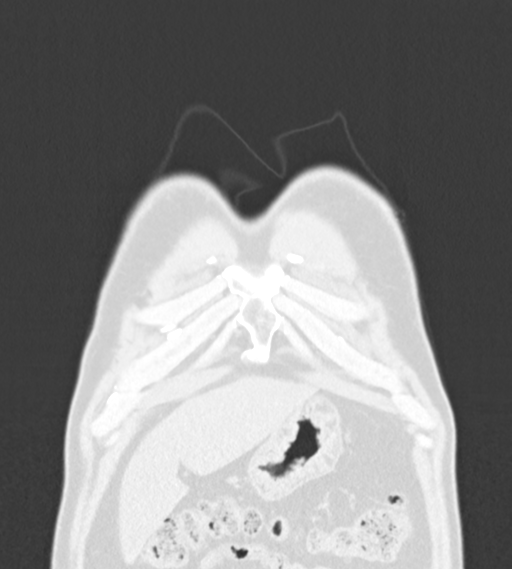
[im 61/152  lung]
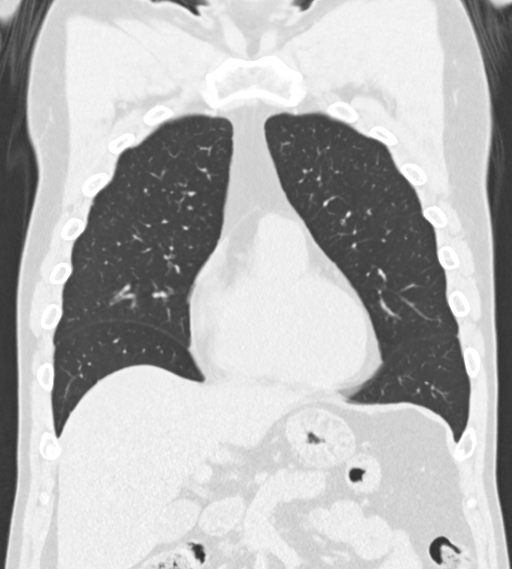
[im 91/152  lung]
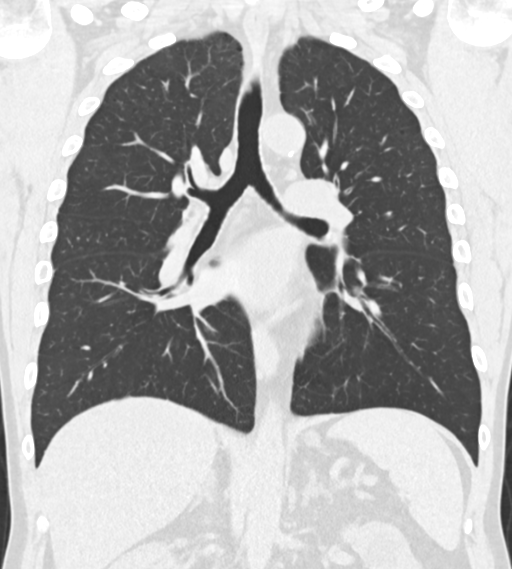

[15 of 36 positions shown; findings below may reference images not displayed]

FINDINGS: Cardiovascular: Incidental note is made of calcification of the
ligamentum arteriosum. Vascular structures are unremarkable. Heart
size normal. No pericardial effusion.

Mediastinum/Nodes: No pathologically enlarged mediastinal or
axillary lymph nodes. Hilar regions are difficult to evaluate
without IV contrast but appear grossly unremarkable. Esophagus is
grossly unremarkable.

Lungs/Pleura: Centrilobular and paraseptal emphysema. Smoking
related respiratory bronchiolitis. Previously seen 3 mm right upper
lobe nodule (4/43) is unchanged and considered benign. No pleural
fluid. Airway is unremarkable.

Upper Abdomen: Visualized portions of the liver, gallbladder,
adrenal glands and right kidney are unremarkable. 3.2 cm low-density
lesion in the left kidney is incompletely imaged. Visualized
portions of the spleen, pancreas, stomach and bowel are unremarkable
with exception of a tiny hiatal hernia. No upper abdominal
adenopathy.

Musculoskeletal: Old right seventh posterolateral rib fracture no
worrisome lytic or sclerotic lesions.
IMPRESSION: 1. No worrisome pulmonary nodules.
2.  Emphysema ([id number]).

## 2020-10-12 ENCOUNTER — Ambulatory Visit: Payer: Commercial Managed Care - PPO | Admitting: Nurse Practitioner

## 2020-11-02 ENCOUNTER — Ambulatory Visit: Payer: Commercial Managed Care - PPO | Admitting: Nurse Practitioner

## 2020-11-30 ENCOUNTER — Ambulatory Visit: Payer: Commercial Managed Care - PPO | Admitting: Nurse Practitioner

## 2021-02-01 ENCOUNTER — Ambulatory Visit: Payer: Self-pay | Admitting: *Deleted

## 2021-02-01 ENCOUNTER — Other Ambulatory Visit: Payer: Self-pay

## 2021-02-01 ENCOUNTER — Emergency Department
Admission: EM | Admit: 2021-02-01 | Discharge: 2021-02-01 | Disposition: A | Discharge status: Home / Self Care | Payer: Commercial Managed Care - PPO | Attending: Emergency Medicine | Admitting: Emergency Medicine

## 2021-02-01 DIAGNOSIS — K649 Unspecified hemorrhoids: Secondary | ICD-10-CM | POA: Diagnosis not present

## 2021-02-01 DIAGNOSIS — Z5321 Procedure and treatment not carried out due to patient leaving prior to being seen by health care provider: Secondary | ICD-10-CM | POA: Diagnosis not present

## 2021-02-01 DIAGNOSIS — K625 Hemorrhage of anus and rectum: Secondary | ICD-10-CM | POA: Diagnosis not present

## 2021-02-01 HISTORY — DX: Chronic obstructive pulmonary disease, unspecified: J44.9

## 2021-02-01 LAB — CBC
HCT: 48.8 % (ref 39.0–52.0)
Hemoglobin: 17.3 g/dL — ABNORMAL HIGH (ref 13.0–17.0)
MCH: 36.2 pg — ABNORMAL HIGH (ref 26.0–34.0)
MCHC: 35.5 g/dL (ref 30.0–36.0)
MCV: 102.1 fL — ABNORMAL HIGH (ref 80.0–100.0)
Platelets: 168 10*3/uL (ref 150–400)
RBC: 4.78 MIL/uL (ref 4.22–5.81)
RDW: 12.6 % (ref 11.5–15.5)
WBC: 5.8 10*3/uL (ref 4.0–10.5)
nRBC: 0 % (ref 0.0–0.2)

## 2021-02-01 LAB — COMPREHENSIVE METABOLIC PANEL
ALT: 69 U/L — ABNORMAL HIGH (ref 0–44)
AST: 109 U/L — ABNORMAL HIGH (ref 15–41)
Albumin: 4.4 g/dL (ref 3.5–5.0)
Alkaline Phosphatase: 54 U/L (ref 38–126)
Anion gap: 13 (ref 5–15)
BUN: 10 mg/dL (ref 6–20)
CO2: 25 mmol/L (ref 22–32)
Calcium: 9.5 mg/dL (ref 8.9–10.3)
Chloride: 100 mmol/L (ref 98–111)
Creatinine, Ser: 0.88 mg/dL (ref 0.61–1.24)
GFR, Estimated: >60 mL/min (ref >60)
Glucose, Bld: 120 mg/dL — ABNORMAL HIGH (ref 70–99)
Potassium: 3.7 mmol/L (ref 3.5–5.1)
Sodium: 138 mmol/L (ref 135–145)
Total Bilirubin: 0.9 mg/dL (ref 0.3–1.2)
Total Protein: 7.5 g/dL (ref 6.5–8.1)

## 2021-02-01 NOTE — Telephone Encounter (Signed)
3rd attempt to contact patient to review symptoms. No answer, unable to leave message, no voicemail box has been set up . Please advise.

## 2021-02-01 NOTE — Telephone Encounter (Signed)
See previous attempts to contact patient

## 2021-02-01 NOTE — ED Notes (Signed)
Called to be roomed but no response

## 2021-02-01 NOTE — Telephone Encounter (Signed)
2nd attempt to contact patient . Patient not accepting calls at this time. Unable to leave message.

## 2021-02-01 NOTE — Telephone Encounter (Signed)
Also, tried to call pt back with no answer and no vm setup

## 2021-02-01 NOTE — Telephone Encounter (Signed)
Patient called in said had busted hemorrhoid, but only has light bleeding now and wants to know what to do to keep it clean. Please call back   Called patient to review symptoms and advise. No answer, no voicemail box has been set up at this time. Unable to leave message to call back .

## 2021-02-01 NOTE — ED Triage Notes (Signed)
Pt to ED for to ED for bright red and dark red blood from rectum when wiping after having BM this am. Reports has had hemorrhoid. Skin color WDL

## 2021-03-22 ENCOUNTER — Emergency Department
Admission: EM | Admit: 2021-03-22 | Discharge: 2021-03-23 | Disposition: A | Discharge status: Home / Self Care | Payer: Commercial Managed Care - PPO | Attending: Emergency Medicine | Admitting: Emergency Medicine

## 2021-03-22 DIAGNOSIS — Z79899 Other long term (current) drug therapy: Secondary | ICD-10-CM | POA: Insufficient documentation

## 2021-03-22 DIAGNOSIS — F1010 Alcohol abuse, uncomplicated: Secondary | ICD-10-CM

## 2021-03-22 DIAGNOSIS — R000 Tachycardia, unspecified: Secondary | ICD-10-CM | POA: Insufficient documentation

## 2021-03-22 DIAGNOSIS — F17210 Nicotine dependence, cigarettes, uncomplicated: Secondary | ICD-10-CM | POA: Insufficient documentation

## 2021-03-22 DIAGNOSIS — I10 Essential (primary) hypertension: Secondary | ICD-10-CM | POA: Insufficient documentation

## 2021-03-22 DIAGNOSIS — F10129 Alcohol abuse with intoxication, unspecified: Secondary | ICD-10-CM | POA: Insufficient documentation

## 2021-03-22 DIAGNOSIS — F10920 Alcohol use, unspecified with intoxication, uncomplicated: Secondary | ICD-10-CM

## 2021-03-22 DIAGNOSIS — Z8616 Personal history of COVID-19: Secondary | ICD-10-CM | POA: Insufficient documentation

## 2021-03-22 DIAGNOSIS — J449 Chronic obstructive pulmonary disease, unspecified: Secondary | ICD-10-CM | POA: Insufficient documentation

## 2021-03-22 DIAGNOSIS — Y906 Blood alcohol level of 120-199 mg/100 ml: Secondary | ICD-10-CM | POA: Insufficient documentation

## 2021-03-22 LAB — URINE DRUG SCREEN, QUALITATIVE (ARMC ONLY)
Amphetamines, Ur Screen: NOT DETECTED
Barbiturates, Ur Screen: NOT DETECTED
Benzodiazepine, Ur Scrn: NOT DETECTED
Cannabinoid 50 Ng, Ur ~~LOC~~: NOT DETECTED
Cocaine Metabolite,Ur ~~LOC~~: NOT DETECTED
MDMA (Ecstasy)Ur Screen: NOT DETECTED
Methadone Scn, Ur: NOT DETECTED
Opiate, Ur Screen: NOT DETECTED
Phencyclidine (PCP) Ur S: NOT DETECTED
Tricyclic, Ur Screen: NOT DETECTED

## 2021-03-22 LAB — COMPREHENSIVE METABOLIC PANEL
ALT: 159 U/L — ABNORMAL HIGH (ref 0–44)
AST: 170 U/L — ABNORMAL HIGH (ref 15–41)
Albumin: 4.5 g/dL (ref 3.5–5.0)
Alkaline Phosphatase: 57 U/L (ref 38–126)
Anion gap: 10 (ref 5–15)
BUN: 11 mg/dL (ref 6–20)
CO2: 23 mmol/L (ref 22–32)
Calcium: 9.6 mg/dL (ref 8.9–10.3)
Chloride: 105 mmol/L (ref 98–111)
Creatinine, Ser: 0.57 mg/dL — ABNORMAL LOW (ref 0.61–1.24)
GFR, Estimated: >60 mL/min (ref >60)
Glucose, Bld: 154 mg/dL — ABNORMAL HIGH (ref 70–99)
Potassium: 3.4 mmol/L — ABNORMAL LOW (ref 3.5–5.1)
Sodium: 138 mmol/L (ref 135–145)
Total Bilirubin: 1.2 mg/dL (ref 0.3–1.2)
Total Protein: 7.3 g/dL (ref 6.5–8.1)

## 2021-03-22 LAB — CBC
HCT: 46.5 % (ref 39.0–52.0)
Hemoglobin: 16 g/dL (ref 13.0–17.0)
MCH: 36.2 pg — ABNORMAL HIGH (ref 26.0–34.0)
MCHC: 34.4 g/dL (ref 30.0–36.0)
MCV: 105.2 fL — ABNORMAL HIGH (ref 80.0–100.0)
Platelets: 79 10*3/uL — ABNORMAL LOW (ref 150–400)
RBC: 4.42 MIL/uL (ref 4.22–5.81)
RDW: 12.2 % (ref 11.5–15.5)
WBC: 4.6 10*3/uL (ref 4.0–10.5)
nRBC: 0 % (ref 0.0–0.2)

## 2021-03-22 LAB — ETHANOL: Alcohol, Ethyl (B): 159 mg/dL — ABNORMAL HIGH (ref <10)

## 2021-03-22 MED ORDER — THIAMINE HCL 100 MG/ML IJ SOLN
Freq: Once | INTRAVENOUS | Status: AC
  Filled 2021-03-22: qty 1000

## 2021-03-22 MED ORDER — THIAMINE HCL 100 MG/ML IJ SOLN: 100.0000 mg | Freq: Every day | INTRAMUSCULAR | Status: DC

## 2021-03-22 MED ORDER — LORAZEPAM 2 MG PO TABS
0.0000 mg | ORAL_TABLET | Freq: Four times a day (QID) | ORAL | Status: DC
  Administered 2021-03-23: 1 mg via ORAL
  Filled 2021-03-22: qty 1

## 2021-03-22 MED ORDER — THIAMINE HCL 100 MG PO TABS: 100.0000 mg | ORAL_TABLET | Freq: Every day | ORAL | Status: DC

## 2021-03-22 MED ORDER — LORAZEPAM 2 MG/ML IJ SOLN
0.0000 mg | Freq: Four times a day (QID) | INTRAMUSCULAR | Status: DC
  Administered 2021-03-22: 2 mg via INTRAVENOUS
  Filled 2021-03-22: qty 1

## 2021-03-22 NOTE — ED Provider Notes (Signed)
[facility] Emergency Department Provider Note   ____________________________________________   Event Date/Time   First MD Initiated Contact with Patient 03/22/21 2319     (approximate)  I have reviewed the triage vital signs and the nursing notes.   HISTORY  Chief Complaint Alcohol Problem    HPI Anthony Maday. is a 50 y.o. male who presents to the ED from home requesting alcohol detox.  Patient has escalated to drinking over 1/5 of liquor per day.  Lost his job and his children have nothing to do with him.  Denies SI/HI/AH/VH.  Tried to detox himself last week but was able to sleep, fevers and shaking.  Denies history of DTs.  Last EtOH around 5 PM.  Denies chest pain, shortness of breath, abdominal pain, nausea or vomiting.      Past Medical History:  Diagnosis Date   COPD (chronic obstructive pulmonary disease) (HCC)    Dermatitis    Hypertension    Migraines    Vitamin D deficiency disease    9.1 in Feb 2016    Patient Active Problem List   Diagnosis Date Noted   COVID-19 12/30/2019   Tobacco dependence 12/30/2019   Thrombocytopenia (HCC) 01/31/2019   Overweight (BMI 25.0-29.9) 01/31/2019   GERD (gastroesophageal reflux disease) 01/15/2018   Emphysema, unspecified (HCC) 05/29/2017   Pulmonary nodule, right 05/29/2017   Lymphadenitis 05/16/2017   Chronic pain of right ankle 03/14/2016   Anxiety 09/12/2015   Medication monitoring encounter 05/18/2015   Tobacco abuse 04/11/2015   Hypertension    Vitamin D deficiency disease    Dermatitis     Past Surgical History:  Procedure Laterality Date   DENTAL SURGERY  2011    Prior to Admission medications   Medication Sig Start Date End Date Taking? Authorizing Provider  LORazepam (ATIVAN) 1 MG tablet Take 1 tablet (1 mg total) by mouth every 8 (eight) hours as needed. 03/23/21  Yes Irean Hong, MD  albuterol (VENTOLIN HFA) 108 (90 Base) MCG/ACT inhaler Inhale 2 puffs into the  lungs every 4 (four) hours as needed for wheezing or shortness of breath. 12/30/19   Jamelle Haring, MD  amLODipine (NORVASC) 10 MG tablet Take 1 tablet (10 mg total) by mouth daily. 12/30/19   Jamelle Haring, MD  omeprazole (PRILOSEC) 20 MG capsule Take 1 capsule (20 mg total) by mouth daily. 01/15/18   Kerman Passey, MD  propranolol ER (INDERAL LA) 80 MG 24 hr capsule Take 1 capsule (80 mg total) by mouth daily. 12/30/19   Jamelle Haring, MD    Allergies Chantix [varenicline]  Family History  Problem Relation Age of Onset   Cancer Mother        lung   COPD Mother    Asthma Mother    Hypertension Mother    Cancer Paternal Grandfather        lung   Heart disease Maternal Grandmother    Hypertension Maternal Grandmother    Stroke Maternal Grandmother    Diabetes Neg Hx     Social History Social History   Tobacco Use   Smoking status: Every Day    Packs/day: 1.00    Types: Cigarettes   Smokeless tobacco: Never  Vaping Use   Vaping Use: Never used  Substance Use Topics   Alcohol use: Yes    Comment: occasionally   Drug use: No    Review of Systems  Constitutional: No fever/chills Eyes: No visual changes. ENT: No  sore throat. Cardiovascular: Denies chest pain. Respiratory: Denies shortness of breath. Gastrointestinal: No abdominal pain.  No nausea, no vomiting.  No diarrhea.  No constipation. Genitourinary: Negative for dysuria. Musculoskeletal: Negative for back pain. Skin: Negative for rash. Neurological: Negative for headaches, focal weakness or numbness. Psychiatric: Positive for alcoholism and tremors.  ____________________________________________   PHYSICAL EXAM:  VITAL SIGNS: ED Triage Vitals  Enc Vitals Group     BP 03/22/21 1759 (!) 167/117     Pulse Rate 03/22/21 1759 (!) 119     Resp 03/22/21 1759 16     Temp 03/22/21 1759 98.8 F (37.1 C)     Temp Source 03/22/21 1759 Oral     SpO2 03/22/21 1759 94 %     Weight --       Height --      Head Circumference --      Peak Flow --      Pain Score 03/22/21 1800 0     Pain Loc --      Pain Edu? --      Excl. in GC? --     Constitutional: Alert and oriented.  Tremulous appearing and in no acute distress. Eyes: Conjunctivae are normal. PERRL. EOMI. Head: Atraumatic. Nose: No congestion/rhinnorhea. Mouth/Throat: Mucous membranes are moist.   Neck: No stridor.   Cardiovascular: Tachycardic rate, regular rhythm. Grossly normal heart sounds.  Good peripheral circulation. Respiratory: Normal respiratory effort.  No retractions. Lungs CTAB. Gastrointestinal: Soft and nontender to light or deep palpation. No distention. No abdominal bruits. No CVA tenderness. Musculoskeletal: No lower extremity tenderness nor edema.  No joint effusions. Neurologic:  Normal speech and language. No gross focal neurologic deficits are appreciated. No gait instability. Skin:  Skin is warm, dry and intact. No rash noted. Psychiatric: Mood and affect are normal. Speech and behavior are normal.  ____________________________________________   LABS (all labs ordered are listed, but only abnormal results are displayed)  Labs Reviewed  COMPREHENSIVE METABOLIC PANEL - Abnormal; Notable for the following components:      Result Value   Potassium 3.4 (*)    Glucose, Bld 154 (*)    Creatinine, Ser 0.57 (*)    AST 170 (*)    ALT 159 (*)    All other components within normal limits  ETHANOL - Abnormal; Notable for the following components:   Alcohol, Ethyl (B) 159 (*)    All other components within normal limits  CBC - Abnormal; Notable for the following components:   MCV 105.2 (*)    MCH 36.2 (*)    Platelets 79 (*)    All other components within normal limits  URINE DRUG SCREEN, QUALITATIVE ([facility] ONLY)   ____________________________________________  EKG  ED ECG REPORT I, Angeles Paolucci J, the attending physician, personally viewed and interpreted this ECG.   Date: 03/23/2021  EKG  Time: 1819  Rate: 106  Rhythm: sinus tachycardia  Axis: Normal  Intervals:none  ST&T Change: Nonspecific  ____________________________________________  RADIOLOGY I, Naheim Burgen J, personally viewed and evaluated these images (plain radiographs) as part of my medical decision making, as well as reviewing the written report by the radiologist.  ED MD interpretation: None  Official radiology report(s): No results found.  ____________________________________________   PROCEDURES  Procedure(s) performed (including Critical Care):  Procedures   ____________________________________________   INITIAL IMPRESSION / ASSESSMENT AND PLAN / ED COURSE  As part of my medical decision making, I reviewed the following data within the electronic MEDICAL RECORD NUMBER Nursing notes reviewed and  incorporated, Labs reviewed, Old chart reviewed (oncology office visits), A consult was requested and obtained from this/these consultant(s) TTS, and Notes from prior ED visits     50 year old alcoholic seeking help with detox.  Hypertensive, tachycardic.  Will infuse banana bag, place on CIWA, consult TTS for assistance with detox. The patient has been placed in psychiatric observation due to the need to provide a safe environment for the patient while obtaining psychiatric consultation and evaluation, as well as ongoing medical and medication management to treat the patient's condition.  The patient has not been placed under full IVC at this time.   Clinical Course as of 03/23/21 0720  Wed Mar 23, 2021  3545 Patient was evaluated by TTS who gave him referrals to [facility].  He was kept until the morning.  He is sober and ambulatory with steady gait.  Will discharge home with follow-up with [facility].  Strict return precautions given.  Patient verbalizes understanding agrees with plan of care. [JS]    Clinical Course User Index [JS] Irean Hong, MD     ____________________________________________   FINAL CLINICAL  IMPRESSION(S) / ED DIAGNOSES  Final diagnoses:  Alcoholic intoxication without complication (HCC)  Alcohol abuse     ED Discharge Orders          Ordered    LORazepam (ATIVAN) 1 MG tablet  Every 8 hours PRN        03/23/21 0626             Note:  This document was prepared using Dragon voice recognition software and may include unintentional dictation errors.    Irean Hong, MD 03/23/21 [phone]

## 2021-03-22 NOTE — ED Provider Notes (Signed)
Emergency Medicine Provider Triage Evaluation Note  Anthony Cardenas. , a 50 y.o. male  was evaluated in triage.  Pt complains of nervousness, sleep disturbance, nausea and a request for help with alcohol detox.  Denies chest pain, chest tightness, abdominal pain or seizure-like activity.  Patient reports that he recently lost his job and has had family difficulty.  Last drink was at 5:00 PM today.  Review of Systems  Positive: Patient has tremor, nausea, sleep disturbance.  Negative: No chest pain, chest tightness or abdominal pain.   Physical Exam  BP (!) 167/117 (BP Location: Left Arm)   Pulse (!) 119   Temp 98.8 F (37.1 C) (Oral)   Resp 16   SpO2 94%  Gen:   Awake, no distress   Resp:  Normal effort  MSK:   Moves extremities without difficulty  Other:    Medical Decision Making  Medically screening exam initiated at 6:05 PM.  Appropriate orders placed.  [name]. was informed that the remainder of the evaluation will be completed by another provider, this initial triage assessment does not replace that evaluation, and the importance of remaining in the ED until their evaluation is complete.     Pia Mau [place], PA-C 03/22/21 1806    Delton [place], MD 03/22/21 [phone]

## 2021-03-22 NOTE — ED Triage Notes (Signed)
Pt to ED requesting alcohol detox. Pt reports drinking over a fifth of liqueur a day. Pt reports last week he tried to detox himself and was unable to sleep, feverish and shaking. Last drink was at 1700.   Pt tearful in triage reporting a recent job loss and family stress that has made the last couple weeks hard.

## 2021-03-23 MED ORDER — LORAZEPAM 1 MG PO TABS
1.0000 mg | ORAL_TABLET | Freq: Three times a day (TID) | ORAL | 0 refills | Status: DC | PRN
Start: 2021-03-23 — End: 2022-01-15

## 2021-03-23 NOTE — Discharge Instructions (Signed)
You may take Ativan every 8 hours as needed for withdrawal symptoms.  Call [facility] this morning for appointment. Return to the ER for worsening symptoms, persistent vomiting, difficulty breathing, lethargy or other concerns.

## 2021-03-23 NOTE — BH Assessment (Signed)
Comprehensive Clinical Assessment (CCA) Note  03/23/2021 Anthony Cardenas [id number]  Chief Complaint: Patient is a 50 year old male presenting to [facility] ED voluntarily seeking detox treatment. Per triage note Pt to ED requesting alcohol detox. Pt reports drinking over a fifth of liqueur a day. Pt reports last week he tried to detox himself and was unable to sleep, feverish and shaking. Last drink was at 1700. Pt tearful in triage reporting a recent job loss and family stress that has made the last couple weeks hard. During assessment patient appears alert and oriented x4, calm and cooperative with some shaking. Patient reports that he drinks "1/2 gallon of Southern Comfort" daily. "I wasn't drinking that much before but I lost my job." Patient reports that he has been drinking daily for more than 5 years and his age of first use is 34. Patient reports current withdrawal symptoms "shakes, fevers, no appetite." Patient denies any history of seizures. Patient reports difficulty sleeping when he is not drinking. Patient denies SI/HI/AH/VH and does not appear to be responding to any internal or external stimuli.  Chief Complaint  Patient presents with   Alcohol Problem   Visit Diagnosis: Alcohol Use Disorder, severe    CCA Screening, Triage and Referral (STR)  Patient Reported Information How did you hear about [place]? Self  Referral name: No data recorded Referral phone number: No data recorded  Whom do you see for routine medical problems? No data recorded Practice/Facility Name: No data recorded Practice/Facility Phone Number: No data recorded Name of Contact: No data recorded Contact Number: No data recorded Contact Fax Number: No data recorded Prescriber Name: No data recorded Prescriber Address (if known): No data recorded  What Is the Reason for Your Visit/Call Today? Patient presents voluntarily seeking detox treatment  How Long Has This Been Causing You Problems? > than 6  months  What Do You Feel Would Help You the Most Today? Alcohol or Drug Use Treatment   Have You Recently Been in Any Inpatient Treatment (Hospital/Detox/Crisis Center/28-Day Program)? No data recorded Name/Location of Program/Hospital:No data recorded How Long Were You There? No data recorded When Were You Discharged? No data recorded  Have You Ever Received Services From [facility] Before? No data recorded Who Do You See at [facility]? No data recorded  Have You Recently Had Any Thoughts About Hurting Yourself? No  Are You Planning to Commit Suicide/Harm Yourself At This time? No   Have you Recently Had Thoughts About Hurting Someone Karolee Ohs? No  Explanation: No data recorded  Have You Used Any Alcohol or Drugs in the Past 24 Hours? Yes  How Long Ago Did You Use Drugs or Alcohol? No data recorded What Did You Use and How Much? "1/2 gallon of Southern Comfort"   Do You Currently Have a Therapist/Psychiatrist? No  Name of Therapist/Psychiatrist: No data recorded  Have You Been Recently Discharged From Any Office Practice or Programs? No  Explanation of Discharge From Practice/Program: No data recorded    CCA Screening Triage Referral Assessment Type of Contact: Face-to-Face  Is this Initial or Reassessment? No data recorded Date Telepsych consult ordered in [facility]:  No data recorded Time Telepsych consult ordered in [facility]:  No data recorded  Patient Reported Information Reviewed? No data recorded Patient Left Without Being Seen? No data recorded Reason for Not Completing Assessment: No data recorded  Collateral Involvement: No data recorded  Does Patient Have a Court Appointed Legal Guardian? No data recorded Name and Contact of Legal  Guardian: No data recorded If Minor and Not Living with Parent(s), Who has Custody? No data recorded Is CPS involved or ever been involved? Never  Is APS involved or ever been involved? Never   Patient Determined To Be At Risk for  Harm To Self or Others Based on Review of Patient Reported Information or Presenting Complaint? No  Method: No data recorded Availability of Means: No data recorded Intent: No data recorded Notification Required: No data recorded Additional Information for Danger to Others Potential: No data recorded Additional Comments for Danger to Others Potential: No data recorded Are There Guns or Other Weapons in Your Home? No data recorded Types of Guns/Weapons: No data recorded Are These Weapons Safely Secured?                            No data recorded Who Could Verify You Are Able To Have These Secured: No data recorded Do You Have any Outstanding Charges, Pending Court Dates, Parole/Probation? No data recorded Contacted To Inform of Risk of Harm To Self or Others: No data recorded  Location of Assessment: [facility] ED   Does Patient Present under Involuntary Commitment? No  IVC Papers Initial File Date: No data recorded  [place] of Residence: [place]   Patient Currently Receiving the Following Services: No data recorded  Determination of Need: Emergent (2 hours)   Options For Referral: No data recorded    CCA Biopsychosocial Intake/Chief Complaint:  No data recorded Current Symptoms/Problems: No data recorded  Patient Reported Schizophrenia/Schizoaffective Diagnosis in Past: No   Strengths: Patient is able to communicate his needs  Preferences: No data recorded Abilities: No data recorded  Type of Services Patient Feels are Needed: No data recorded  Initial Clinical Notes/Concerns: No data recorded  Mental Health Symptoms Depression:   Change in energy/activity; Hopelessness   Duration of Depressive symptoms:  Greater than two weeks   Mania:   None   Anxiety:    Difficulty concentrating; Restlessness   Psychosis:   None   Duration of Psychotic symptoms: No data recorded  Trauma:   None   Obsessions:   None   Compulsions:   None   Inattention:    None   Hyperactivity/Impulsivity:   None   Oppositional/Defiant Behaviors:   None   Emotional Irregularity:   None   Other Mood/Personality Symptoms:  No data recorded   Mental Status Exam Appearance and self-care  Stature:   Average   Weight:   Average weight   Clothing:   Casual   Grooming:   Normal   Cosmetic use:   None   Posture/gait:   Normal   Motor activity:   Not Remarkable   Sensorium  Attention:   Normal   Concentration:   Normal   Orientation:   X5   Recall/memory:   Normal   Affect and Mood  Affect:   Appropriate   Mood:   Anxious   Relating  Eye contact:   Normal   Facial expression:   Anxious   Attitude toward examiner:   Cooperative   Thought and Language  Speech flow:  Clear and Coherent   Thought content:   Appropriate to Mood and Circumstances   Preoccupation:   None   Hallucinations:   None   Organization:  No data recorded  [employer] of Knowledge:   Fair   Intelligence:   Average   Abstraction:   Normal   Judgement:  Fair   Dance movement psychotherapist:   Realistic   Insight:   Good   Decision Making:   Normal   Social Functioning  Social Maturity:   Responsible   Social Judgement:   Normal   Stress  Stressors:   Housing; Office manager Ability:   Exhausted   Skill Deficits:   None   Supports:   Support needed     Religion: Religion/Spirituality Are You A Religious Person?: No  Leisure/Recreation: Leisure / Recreation Do You Have Hobbies?: No  Exercise/Diet: Exercise/Diet Do You Exercise?: No Have You Gained or Lost A Significant Amount of Weight in the Past Six Months?: No Do You Follow a Special Diet?: No Do You Have Any Trouble Sleeping?: Yes Explanation of Sleeping Difficulties: Patient reports that he can only sleep "when I'm drinking"   CCA Employment/Education Employment/Work Situation: Employment / Work Situation Employment Situation:  Unemployed Patient's Job has Been Impacted by Current Illness: No Has Patient ever Been in Equities trader?: No  Education: Education Is Patient Currently Attending School?: No Did You Have An Individualized Education Program (IIEP): No Did You Have Any Difficulty At [employer]?: No Patient's Education Has Been Impacted by Current Illness: No   CCA Family/Childhood History Family and Relationship History: Family history Marital status: Single Does patient have children?:  (Unknown)  Childhood History:  Childhood History Did patient suffer any verbal/emotional/physical/sexual abuse as a child?: No Did patient suffer from severe childhood neglect?: No Has patient ever been sexually abused/assaulted/raped as an adolescent or adult?: No Was the patient ever a victim of a crime or a disaster?: No Witnessed domestic violence?: No Has patient been affected by domestic violence as an adult?: No  Child/Adolescent Assessment:     CCA Substance Use Alcohol/Drug Use: Alcohol / Drug Use Pain Medications: See MAR Prescriptions: See MAR Over the Counter: See MAR History of alcohol / drug use?: Yes Withdrawal Symptoms: Change in blood pressure, Fever / Chills, Tremors Substance #1 Name of Substance 1: Alcohol 1 - Age of First Use: 22 1 - Amount (size/oz): "1/2 gallong of Southern [place]fort" 1 - Frequency: daily 1 - Duration: Over 20 years 1 - Last Use / Amount: 03/22/21 "1/2 gallon of southern comfort" 1 - Method of Aquiring: Unknown 1- Route of Use: Oral                       ASAM's:  Six Dimensions of Multidimensional Assessment  Dimension 1:  Acute Intoxication and/or Withdrawal Potential:      Dimension 2:  Biomedical Conditions and Complications:      Dimension 3:  Emotional, Behavioral, or Cognitive Conditions and Complications:     Dimension 4:  Readiness to Change:     Dimension 5:  Relapse, Continued use, or Continued Problem Potential:     Dimension 6:   Recovery/Living Environment:     ASAM Severity Score:    ASAM Recommended Level of Treatment:     Substance use Disorder (SUD) Substance Use Disorder (SUD)  Checklist Symptoms of Substance Use: Continued use despite having a persistent/recurrent physical/psychological problem caused/exacerbated by use, Evidence of tolerance, Evidence of withdrawal (Comment), Large amounts of time spent to obtain, use or recover from the substance(s), Persistent desire or unsuccessful efforts to cut down or control use, Presence of craving or strong urge to use, Recurrent use that results in a failure to fulfill major role obligations (work, school, home), Social, occupational, recreational activities given up or reduced due to use,  Substance(s) often taken in larger amounts or over longer times than was intended  Recommendations for Services/Supports/Treatments: Recommendations for Services/Supports/Treatments Recommendations For Services/Supports/Treatments: Detox  DSM5 Diagnoses: Patient Active Problem List   Diagnosis Date Noted   COVID-19 12/30/2019   Tobacco dependence 12/30/2019   Thrombocytopenia (HCC) 01/31/2019   Overweight (BMI 25.0-29.9) 01/31/2019   GERD (gastroesophageal reflux disease) 01/15/2018   Emphysema, unspecified (HCC) 05/29/2017   Pulmonary nodule, right 05/29/2017   Lymphadenitis 05/16/2017   Chronic pain of right ankle 03/14/2016   Anxiety 09/12/2015   Medication monitoring encounter 05/18/2015   Tobacco abuse 04/11/2015   Hypertension    Vitamin D deficiency disease    Dermatitis     Patient Centered Plan: Patient is on the following Treatment Plan(s):  Substance Abuse   Referrals to Alternative Service(s): Referred to Alternative Service(s):   Place:   Date:   Time:    Referred to Alternative Service(s):   Place:   Date:   Time:    Referred to Alternative Service(s):   Place:   Date:   Time:    Referred to Alternative Service(s):   Place:   Date:   Time:     Tyquon Near  A Jarrod Mcenery, LCAS-A

## 2021-03-23 NOTE — BH Assessment (Signed)
Referral information for Detox treatment faxed to;   [facility] [phone])  Patient spoke with [facility] staff Meg who completed the patient's pre-screening  Patient is willing to follow-up on his own as he would need to return home to obtain some clothes for the facility. Patient was provided with the phone number and address of the facility. Meg from [facility] advised patient to call back after 8am today, patient was receptive. Communicated this to EDP Dr. Dolores Frame

## 2021-03-23 NOTE — ED Notes (Signed)
Patient discharged to lobby to wait on ride home. Writer reviewed discharge paperwork with patient. Patient verbalized understanding.

## 2021-06-30 ENCOUNTER — Other Ambulatory Visit: Payer: Self-pay

## 2021-06-30 ENCOUNTER — Emergency Department
Admission: EM | Admit: 2021-06-30 | Discharge: 2021-07-01 | Disposition: A | Discharge status: Home / Self Care | Payer: Self-pay | Attending: Emergency Medicine | Admitting: Emergency Medicine

## 2021-06-30 DIAGNOSIS — F1010 Alcohol abuse, uncomplicated: Secondary | ICD-10-CM

## 2021-06-30 DIAGNOSIS — Y907 Blood alcohol level of 200-239 mg/100 ml: Secondary | ICD-10-CM | POA: Insufficient documentation

## 2021-06-30 DIAGNOSIS — Z79899 Other long term (current) drug therapy: Secondary | ICD-10-CM | POA: Insufficient documentation

## 2021-06-30 DIAGNOSIS — I10 Essential (primary) hypertension: Secondary | ICD-10-CM | POA: Insufficient documentation

## 2021-06-30 DIAGNOSIS — F10920 Alcohol use, unspecified with intoxication, uncomplicated: Secondary | ICD-10-CM

## 2021-06-30 DIAGNOSIS — J449 Chronic obstructive pulmonary disease, unspecified: Secondary | ICD-10-CM | POA: Insufficient documentation

## 2021-06-30 DIAGNOSIS — Z8616 Personal history of COVID-19: Secondary | ICD-10-CM | POA: Insufficient documentation

## 2021-06-30 DIAGNOSIS — F10120 Alcohol abuse with intoxication, uncomplicated: Secondary | ICD-10-CM | POA: Insufficient documentation

## 2021-06-30 MED ORDER — LORAZEPAM 2 MG/ML IJ SOLN: 0.0000 mg | Freq: Four times a day (QID) | INTRAMUSCULAR | Status: DC

## 2021-06-30 MED ORDER — LORAZEPAM 2 MG PO TABS
0.0000 mg | ORAL_TABLET | Freq: Four times a day (QID) | ORAL | Status: DC
  Administered 2021-07-01: 2 mg via ORAL
  Filled 2021-06-30: qty 1

## 2021-06-30 MED ORDER — THIAMINE HCL 100 MG PO TABS: 100.0000 mg | ORAL_TABLET | Freq: Every day | ORAL | Status: DC

## 2021-06-30 MED ORDER — THIAMINE HCL 100 MG/ML IJ SOLN: 100.0000 mg | Freq: Every day | INTRAMUSCULAR | Status: DC

## 2021-06-30 NOTE — ED Triage Notes (Signed)
Pt presents to ER from home stating he needs placement in an alcohol detox center.  Pt states last drink was appx 40 minutes ago.  Pt states he drinks around 1/5 of southern comfort per pt.  Pt states he was in rehab a few months ago but left because they would not allow him to smoke cigarettes.  Pt A&O x4 at this time in NAD.   ?

## 2021-06-30 NOTE — ED Notes (Signed)
Pt reports used to take 2 BP meds but hasn't seen a doctor in about a year and ran out of refills so stopped taking them several months ago.  ?

## 2021-06-30 NOTE — ED Provider Notes (Signed)
? ?[facility] ?Provider Note ? ? ? Event Date/Time  ? First MD Initiated Contact with Patient 06/30/21 2340   ?  (approximate) ? ? ?History  ? ?Drug / Alcohol Assessment ? ? ?HPI ? ?Anthony Cardenas. is a 51 y.o. male who presents to the ED from home seeking alcohol detox.  Patient drinks 1/5 of liquor daily; last drink approximately 40 minutes prior to arrival.  Denies active SI/HI/AH/VH.  Voices no medical complaints.  Baseline tremors.  No history of DTs.  Has not taken his prescription medications in over 1 year. ?  ? ? ?Past Medical History  ? ?Past Medical History:  ?Diagnosis Date  ? COPD (chronic obstructive pulmonary disease) (HCC)   ? Dermatitis   ? Hypertension   ? Migraines   ? Vitamin D deficiency disease   ? 9.1 in Feb 2016  ? ? ? ?Active Problem List  ? ?Patient Active Problem List  ? Diagnosis Date Noted  ? COVID-19 12/30/2019  ? Tobacco dependence 12/30/2019  ? Thrombocytopenia (HCC) 01/31/2019  ? Overweight (BMI 25.0-29.9) 01/31/2019  ? GERD (gastroesophageal reflux disease) 01/15/2018  ? Emphysema, unspecified (HCC) 05/29/2017  ? Pulmonary nodule, right 05/29/2017  ? Lymphadenitis 05/16/2017  ? Chronic pain of right ankle 03/14/2016  ? Anxiety 09/12/2015  ? Medication monitoring encounter 05/18/2015  ? Tobacco abuse 04/11/2015  ? Hypertension   ? Vitamin D deficiency disease   ? Dermatitis   ? ? ? ?Past Surgical History  ? ?Past Surgical History:  ?Procedure Laterality Date  ? DENTAL SURGERY  2011  ? ? ? ?Home Medications  ? ?Prior to Admission medications   ?Medication Sig Start Date End Date Taking? Authorizing Provider  ?chlordiazePOXIDE (LIBRIUM) 25 MG capsule Take 1 capsule (25 mg total) by mouth 3 (three) times daily as needed for withdrawal. 07/01/21  Yes Irean Hong, MD  ?albuterol (VENTOLIN HFA) 108 (90 Base) MCG/ACT inhaler Inhale 2 puffs into the lungs every 4 (four) hours as needed for wheezing or shortness of breath. ?Patient not taking: Reported on 06/30/2021  12/30/19   Jamelle Haring, MD  ?amLODipine (NORVASC) 10 MG tablet Take 1 tablet (10 mg total) by mouth daily. ?Patient not taking: Reported on 06/30/2021 12/30/19   Jamelle Haring, MD  ?LORazepam (ATIVAN) 1 MG tablet Take 1 tablet (1 mg total) by mouth every 8 (eight) hours as needed. ?Patient not taking: Reported on 06/30/2021 03/23/21   Irean Hong, MD  ?omeprazole (PRILOSEC) 20 MG capsule Take 1 capsule (20 mg total) by mouth daily. ?Patient not taking: Reported on 06/30/2021 01/15/18   Kerman Passey, MD  ?propranolol ER (INDERAL LA) 80 MG 24 hr capsule Take 1 capsule (80 mg total) by mouth daily. ?Patient not taking: Reported on 06/30/2021 12/30/19   Jamelle Haring, MD  ? ? ? ?Allergies  ?Chantix [varenicline] ? ? ?Family History  ? ?Family History  ?Problem Relation Age of Onset  ? Cancer Mother   ?     lung  ? COPD Mother   ? Asthma Mother   ? Hypertension Mother   ? Cancer Paternal Grandfather   ?     lung  ? Heart disease Maternal Grandmother   ? Hypertension Maternal Grandmother   ? Stroke Maternal Grandmother   ? Diabetes Neg Hx   ? ? ? ?Physical Exam  ?Triage Vital Signs: ?ED Triage Vitals  ?Enc Vitals Group  ?   BP 06/30/21 2117 (!) 191/111  ?  Pulse Rate 06/30/21 2117 100  ?   Resp 06/30/21 2117 18  ?   Temp 06/30/21 2117 99.2 ?F (37.3 ?C)  ?   Temp Source 06/30/21 2117 Oral  ?   SpO2 06/30/21 2117 94 %  ?   Weight 06/30/21 2119 150 lb (68 kg)  ?   Height 06/30/21 2119 5\' 9"  (1.753 m)  ?   Head Circumference --   ?   Peak Flow --   ?   Pain Score 06/30/21 2123 0  ?   Pain Loc --   ?   Pain Edu? --   ?   Excl. in GC? --   ? ? ?Updated Vital Signs: ?BP (!) 168/122 (BP Location: Left Arm)   Pulse 86   Temp 99.2 ?F (37.3 ?C) (Oral)   Resp 18   Ht 5\' 9"  (1.753 m)   Wt 68 kg   SpO2 99%   BMI 22.15 kg/m?  ? ? ?General: Awake, no distress.  ?CV:  RRR.  Good peripheral perfusion.  ?Resp:  Normal effort.  CTA B. ?Abd:  Nontender.  No distention.  ?Other:  Alert and oriented x3.  CN II-XII  intact.  MAEx4. Tremulous, not agitated ? ? ?ED Results / Procedures / Treatments  ?Labs ?(all labs ordered are listed, but only abnormal results are displayed) ?Labs Reviewed  ?CBC - Abnormal; Notable for the following components:  ?    Result Value  ? MCV 101.1 (*)   ? All other components within normal limits  ?COMPREHENSIVE METABOLIC PANEL - Abnormal; Notable for the following components:  ? Potassium 3.4 (*)   ? Glucose, Bld 151 (*)   ? All other components within normal limits  ?ETHANOL - Abnormal; Notable for the following components:  ? Alcohol, Ethyl (B) 236 (*)   ? All other components within normal limits  ? ? ? ?EKG ? ?None ? ? ?RADIOLOGY ?None ? ? ?Official radiology report(s): ?No results found. ? ? ?PROCEDURES: ? ?Critical Care performed: No ? ?Procedures ? ? ?MEDICATIONS ORDERED IN ED: ?Medications  ?LORazepam (ATIVAN) injection 0-4 mg ( Intravenous See Alternative 07/01/21 0109)  ?  Or  ?LORazepam (ATIVAN) tablet 0-4 mg (2 mg Oral Given 07/01/21 0109)  ?thiamine tablet 100 mg (has no administration in time range)  ?  Or  ?thiamine (B-1) injection 100 mg (has no administration in time range)  ? ? ? ?IMPRESSION / MDM / ASSESSMENT AND PLAN / ED COURSE  ?I reviewed the triage vital signs and the nursing notes. ?             ?               ?51 year old male seeking alcohol detox.  Will place on CIWA scale, consult TTS to evaluate patient in the ED.  Patient asking to smoke cigarette outside; offered nicotine patch but patient states he cannot take it due to palpitations. ? ?0217 ?TTS has evaluated patient who is looking for a detox facility which allows him to smoke. There are none available. TTS has given patient outpatient resources. Will prescribe Librium to use as needed. Strict return precautions given. Patient verbalizes understanding agrees with plan of care. ? ?FINAL CLINICAL IMPRESSION(S) / ED DIAGNOSES  ? ?Final diagnoses:  ?Alcohol abuse  ?Alcoholic intoxication without complication (HCC)   ? ? ? ?Rx / DC Orders  ? ?ED Discharge Orders   ? ?      Ordered  ?  chlordiazePOXIDE (LIBRIUM) 25  MG capsule  3 times daily PRN       ? 07/01/21 0218  ? ?  ?  ? ?  ? ? ? ?Note:  This document was prepared using Dragon voice recognition software and may include unintentional dictation errors. ?  ?Irean HongSung, Yossi Hinchman J, MD ?07/01/21 [phone] ? ?

## 2021-06-30 NOTE — ED Notes (Signed)
See triage note. Pt denies SI/HI. Reports recent detox facility he was at in [place] he had a room with another male who made him anxious and that he didn't trust. Reports also wasn't allowed to smoke cigarettes there and is a daily smoker. Declined offer of nicotine patch stating they give him palpitations. Pt given drink, pt declined food.  ?

## 2021-06-30 NOTE — ED Notes (Signed)
EDP Sung assessing pt.  ?

## 2021-06-30 NOTE — ED Notes (Addendum)
Pt offered warm blanket; pt declined. Pt denies CP and SOB. Resp reg/unlabored; skin dry; calmly sitting on bed.  ?

## 2021-07-01 LAB — COMPREHENSIVE METABOLIC PANEL
ALT: 20 U/L (ref 0–44)
AST: 33 U/L (ref 15–41)
Albumin: 4.1 g/dL (ref 3.5–5.0)
Alkaline Phosphatase: 62 U/L (ref 38–126)
Anion gap: 12 (ref 5–15)
BUN: 11 mg/dL (ref 6–20)
CO2: 26 mmol/L (ref 22–32)
Calcium: 9 mg/dL (ref 8.9–10.3)
Chloride: 104 mmol/L (ref 98–111)
Creatinine, Ser: 0.62 mg/dL (ref 0.61–1.24)
GFR, Estimated: >60 mL/min (ref >60)
Glucose, Bld: 151 mg/dL — ABNORMAL HIGH (ref 70–99)
Potassium: 3.4 mmol/L — ABNORMAL LOW (ref 3.5–5.1)
Sodium: 142 mmol/L (ref 135–145)
Total Bilirubin: 0.6 mg/dL (ref 0.3–1.2)
Total Protein: 7 g/dL (ref 6.5–8.1)

## 2021-07-01 LAB — CBC
HCT: 46.8 % (ref 39.0–52.0)
Hemoglobin: 15.7 g/dL (ref 13.0–17.0)
MCH: 33.9 pg (ref 26.0–34.0)
MCHC: 33.5 g/dL (ref 30.0–36.0)
MCV: 101.1 fL — ABNORMAL HIGH (ref 80.0–100.0)
Platelets: 163 10*3/uL (ref 150–400)
RBC: 4.63 MIL/uL (ref 4.22–5.81)
RDW: 13.1 % (ref 11.5–15.5)
WBC: 6.7 10*3/uL (ref 4.0–10.5)
nRBC: 0 % (ref 0.0–0.2)

## 2021-07-01 LAB — ETHANOL: Alcohol, Ethyl (B): 236 mg/dL — ABNORMAL HIGH (ref <10)

## 2021-07-01 MED ORDER — CHLORDIAZEPOXIDE HCL 25 MG PO CAPS: 25.0000 mg | ORAL_CAPSULE | Freq: Three times a day (TID) | ORAL | 0 refills | Status: DC | PRN

## 2021-07-01 NOTE — ED Notes (Signed)
Pt to interview room with psych staff member.  ?

## 2021-07-01 NOTE — ED Notes (Signed)
EDP remains aware of BP. No new orders. ?

## 2021-07-01 NOTE — Discharge Instructions (Addendum)
You may take Librium up to 3 times daily as needed for anxiety/withdrawal symptoms.  Return to the ER for worsening symptoms, persistent vomiting, difficulty breathing or other concerns. ?

## 2021-07-01 NOTE — ED Notes (Signed)
Pt getting anxious in desire to smoke. Pt told of policy that he is not allowed to go outside and smoke. Pt attempting to resist desire. Pt offered nicotine patch again but pt continued to refuse it.  ?

## 2021-07-01 NOTE — ED Notes (Signed)
Pt back to bed from interview room.  ?

## 2021-07-01 NOTE — BH Assessment (Signed)
Patient declined wanting [facility] or [facility] detox services due to not being able to smoke in those facilities. ? ?Patient was given the following outpatient resources: ?[facility] ?[facility] ?[facility] ?

## 2021-07-12 ENCOUNTER — Ambulatory Visit: Payer: Self-pay | Admitting: *Deleted

## 2021-07-12 NOTE — Telephone Encounter (Signed)
Third attempt to contact pt however voicemail box is not set up yet so unable to leave a message.  Per policy I have forwarded to [facility]. ?

## 2021-07-12 NOTE — Telephone Encounter (Signed)
Summary: Antabuse/ Disulfiram question  ? pt is waiting to be seen at an out patient facility today due to alcohol abuse and wants to know if [facility] office prescribes Disulfiram/ Antabuse / pt got out of [facility] yesterday / and asked if this med is prescribed by providers here/ please advise/ pt has been 8 days with no drinking and would like to schedule an appt If he can receive this medication   ?  ?Called Pt  - voice mail is not set up - unable to [facility] ?

## 2021-07-12 NOTE — Telephone Encounter (Signed)
A receptionist at [facility] has replied to a message asking this same question and a CRM was placed letting them know that we do not have that medication at our facility.

## 2021-07-12 NOTE — Telephone Encounter (Signed)
I returned pt's call.  He is waiting an an outpatient facility due to alcohol abuse to be seen.  Pt got out of [facility] yesterday.  Been 8 days without alcohol.  He wants to know if providers at [facility] prescribe Antabuse/Disulfiram.   If so he wants an appt. ? ?LOV 12/30/2019 with Dr. Dorris Fetch.   ? ?Got message that the voice mailbox has not been set up yet to try my call again at a later time. ? ? ?

## 2021-12-07 ENCOUNTER — Emergency Department
Admission: EM | Admit: 2021-12-07 | Discharge: 2021-12-07 | Disposition: A | Discharge status: Home / Self Care | Payer: Medicaid Other | Attending: Emergency Medicine | Admitting: Emergency Medicine

## 2021-12-07 ENCOUNTER — Emergency Department: Payer: Medicaid Other

## 2021-12-07 ENCOUNTER — Encounter: Payer: Self-pay | Admitting: Emergency Medicine

## 2021-12-07 ENCOUNTER — Other Ambulatory Visit: Payer: Self-pay

## 2021-12-07 DIAGNOSIS — Y909 Presence of alcohol in blood, level not specified: Secondary | ICD-10-CM | POA: Insufficient documentation

## 2021-12-07 DIAGNOSIS — R55 Syncope and collapse: Secondary | ICD-10-CM | POA: Insufficient documentation

## 2021-12-07 DIAGNOSIS — F10920 Alcohol use, unspecified with intoxication, uncomplicated: Secondary | ICD-10-CM

## 2021-12-07 DIAGNOSIS — F10120 Alcohol abuse with intoxication, uncomplicated: Secondary | ICD-10-CM | POA: Insufficient documentation

## 2021-12-07 NOTE — ED Notes (Signed)
Pt escorted to CT by this RN.

## 2021-12-07 NOTE — ED Notes (Signed)
Pt found standing outside in parking lot, unsteady; pt assisted into w/c and brought back to 1H for further evaluation; pt refuses any procedures at this time, very restless; Dr Derrill Kay notified and over to speak with pt; pt agrees to CT scan but st "do not call anyone"

## 2021-12-07 NOTE — ED Provider Notes (Signed)
[facility] Provider Note    Event Date/Time   First MD Initiated Contact with Patient 12/07/21 2055     (approximate)   History   Alcohol Intoxication   HPI  Anthony Cardenas. is a 51 y.o. male who presented to the emergency department today brought in by EMS after being found apparently intoxicated and on the ground.  Patient states that he has been drinking tonight.  States that he fell when he was walking.  He denies any complaints.   Physical Exam   Triage Vital Signs: ED Triage Vitals  Enc Vitals Group     BP 12/07/21 2052 (!) 192/109     Pulse Rate 12/07/21 2052 83     Resp 12/07/21 2052 20     Temp 12/07/21 2052 97.7 F (36.5 C)     Temp Source 12/07/21 2052 Oral     SpO2 12/07/21 2029 95 %     Weight 12/07/21 2054 160 lb (72.6 kg)     Height 12/07/21 2054 5\' 9"  (1.753 m)     Head Circumference --      Peak Flow --      Pain Score 12/07/21 2053 0     Pain Loc --      Pain Edu? --      Excl. in GC? --     Most recent vital signs: Vitals:   12/07/21 2029 12/07/21 2052  BP:  (!) 192/109  Pulse:  83  Resp:  20  Temp:  97.7 F (36.5 C)  SpO2: 95% 97%    General: Awake, alert. Appears intoxicated. CV:  Good peripheral perfusion. Regular rhythm. Resp:  Normal effort. Lungs clear. Abd:  No distention.     ED Results / Procedures / Treatments   Labs (all labs ordered are listed, but only abnormal results are displayed) Labs Reviewed - No data to display   EKG  None   RADIOLOGY I independently interpreted and visualized the ct head. My interpretation: No large bleed. Radiology interpretation:  IMPRESSION:  No acute intracranial pathology.    I independently interpreted and visualized the ct cervical spine. My interpretation: No fracture. Radiology interpretation:  IMPRESSION:  No acute fracture or subluxation in the cervical spine.      PROCEDURES:  Critical Care performed:  No  Procedures   MEDICATIONS ORDERED IN ED: Medications - No data to display   IMPRESSION / MDM / ASSESSMENT AND PLAN / ED COURSE  I reviewed the triage vital signs and the nursing notes.                              Differential diagnosis includes, but is not limited to, alcohol intoxication, intracranial process.  Patient's presentation is most consistent with acute presentation with potential threat to life or bodily function.  Patient presents to the emergency department today after being found intoxicated.  Patient does appear intoxicated on exam.  Did state that he fell.  Did obtain CT head and cervical spine to make sure there is no acute traumatic injury.  Neither showed any concerning findings.  He was able to contact someone to come pick him up.     FINAL CLINICAL IMPRESSION(S) / ED DIAGNOSES   Final diagnoses:  Alcoholic intoxication without complication (HCC)     Note:  This document was prepared using Dragon voice recognition software and may include unintentional dictation errors.    12/09/21,  MD 12/07/21 2259

## 2021-12-07 NOTE — ED Notes (Signed)
Pt provided a sandwich tray and apple juice.

## 2021-12-07 NOTE — ED Notes (Signed)
Pts Dad contacted Rayna Sexton Sr.) who stated he would not be able to come pick up the patient nor come up to the hospital due to having company at his house at this time.

## 2021-12-07 NOTE — Discharge Instructions (Signed)
Please seek medical attention for any high fevers, chest pain, shortness of breath, change in behavior, persistent vomiting, bloody stool or any other new or concerning symptoms.  

## 2021-12-07 NOTE — ED Triage Notes (Signed)
EMS brings pt in from local convenience store--found by bystander nonresponsive; "fall" per pt; denies LOC or hitting head; +ETOH

## 2021-12-07 NOTE — ED Notes (Addendum)
Support person Almira Coaster contacted by the patient to provided a safe ride home. Upon arrival the patient was escorted to the lobby via wheelchair.   E-signature pad unavailable - Pt verbalized understanding of D/C information - no additional concerns at this time.

## 2021-12-12 ENCOUNTER — Encounter: Payer: Self-pay | Admitting: Emergency Medicine

## 2021-12-12 DIAGNOSIS — I10 Essential (primary) hypertension: Secondary | ICD-10-CM | POA: Insufficient documentation

## 2021-12-12 DIAGNOSIS — F17200 Nicotine dependence, unspecified, uncomplicated: Secondary | ICD-10-CM | POA: Insufficient documentation

## 2021-12-12 DIAGNOSIS — J449 Chronic obstructive pulmonary disease, unspecified: Secondary | ICD-10-CM | POA: Insufficient documentation

## 2021-12-12 DIAGNOSIS — Z8616 Personal history of COVID-19: Secondary | ICD-10-CM | POA: Insufficient documentation

## 2021-12-12 DIAGNOSIS — K4090 Unilateral inguinal hernia, without obstruction or gangrene, not specified as recurrent: Secondary | ICD-10-CM | POA: Insufficient documentation

## 2021-12-12 LAB — COMPREHENSIVE METABOLIC PANEL
ALT: 16 U/L (ref 0–44)
AST: 23 U/L (ref 15–41)
Albumin: 4.5 g/dL (ref 3.5–5.0)
Alkaline Phosphatase: 77 U/L (ref 38–126)
Anion gap: 8 (ref 5–15)
BUN: 9 mg/dL (ref 6–20)
CO2: 28 mmol/L (ref 22–32)
Calcium: 9.9 mg/dL (ref 8.9–10.3)
Chloride: 100 mmol/L (ref 98–111)
Creatinine, Ser: 0.71 mg/dL (ref 0.61–1.24)
GFR, Estimated: >60 mL/min (ref >60)
Glucose, Bld: 92 mg/dL (ref 70–99)
Potassium: 3.7 mmol/L (ref 3.5–5.1)
Sodium: 136 mmol/L (ref 135–145)
Total Bilirubin: 1.5 mg/dL — ABNORMAL HIGH (ref 0.3–1.2)
Total Protein: 7.8 g/dL (ref 6.5–8.1)

## 2021-12-12 LAB — CBC WITH DIFFERENTIAL/PLATELET
Abs Immature Granulocytes: 0.06 10*3/uL (ref 0.00–0.07)
Basophils Absolute: 0.1 10*3/uL (ref 0.0–0.1)
Basophils Relative: 1 %
Eosinophils Absolute: 0.2 10*3/uL (ref 0.0–0.5)
Eosinophils Relative: 2 %
HCT: 44.4 % (ref 39.0–52.0)
Hemoglobin: 15 g/dL (ref 13.0–17.0)
Immature Granulocytes: 1 %
Lymphocytes Relative: 14 %
Lymphs Abs: 1.4 10*3/uL (ref 0.7–4.0)
MCH: 33.9 pg (ref 26.0–34.0)
MCHC: 33.8 g/dL (ref 30.0–36.0)
MCV: 100.5 fL — ABNORMAL HIGH (ref 80.0–100.0)
Monocytes Absolute: 0.5 10*3/uL (ref 0.1–1.0)
Monocytes Relative: 6 %
Neutro Abs: 7.5 10*3/uL (ref 1.7–7.7)
Neutrophils Relative %: 76 %
Platelets: 142 10*3/uL — ABNORMAL LOW (ref 150–400)
RBC: 4.42 MIL/uL (ref 4.22–5.81)
RDW: 13.2 % (ref 11.5–15.5)
WBC: 9.8 10*3/uL (ref 4.0–10.5)
nRBC: 0 % (ref 0.0–0.2)

## 2021-12-12 LAB — URINALYSIS, ROUTINE W REFLEX MICROSCOPIC
Bilirubin Urine: NEGATIVE
Glucose, UA: NEGATIVE mg/dL
Hgb urine dipstick: NEGATIVE
Ketones, ur: NEGATIVE mg/dL
Leukocytes,Ua: NEGATIVE
Nitrite: NEGATIVE
Protein, ur: 100 mg/dL — AB
Specific Gravity, Urine: 1.015 (ref 1.005–1.030)
Squamous Epithelial / HPF: NONE SEEN (ref 0–5)
pH: 8 (ref 5.0–8.0)

## 2021-12-12 NOTE — ED Triage Notes (Signed)
Pt presents via POV with complaints of left sided inguinal hernia that has gotten larger over the last day. He notes chasing after his dog and straining to grab him and that's when the pain began. Pt has used ice which has helped with the swelling.

## 2021-12-13 ENCOUNTER — Emergency Department
Admission: EM | Admit: 2021-12-13 | Discharge: 2021-12-13 | Disposition: A | Discharge status: Home / Self Care | Payer: Medicaid Other | Attending: Emergency Medicine | Admitting: Emergency Medicine

## 2021-12-13 DIAGNOSIS — K4090 Unilateral inguinal hernia, without obstruction or gangrene, not specified as recurrent: Secondary | ICD-10-CM

## 2021-12-13 NOTE — Discharge Instructions (Addendum)
Please follow-up with general surgery regarding your inguinal hernia.  If the hernia pops out and is not reduced or becomes hard or there is skin changes overlying it please return to the emergency department.

## 2021-12-13 NOTE — ED Provider Notes (Signed)
[facility] Provider Note    Event Date/Time   First MD Initiated Contact with Patient 12/13/21 0021     (approximate)   History   Inguinal Hernia   HPI  Alphonse Asbridge. is a 51 y.o. male past medical history of alcohol use disorder COPD presents with a bulge in the left inguinal region.  Patient has noticed what he thought was a hernia over the last week or 2.  Today after he was pulling his dog he felt the bulge increased in size to about the size of a plum.  It does improve when he lies down.  Denies any associated abdominal pain nausea vomiting or constipation.    Past Medical History:  Diagnosis Date   COPD (chronic obstructive pulmonary disease) (HCC)    Dermatitis    Hypertension    Migraines    Vitamin D deficiency disease    9.1 in Feb 2016    Patient Active Problem List   Diagnosis Date Noted   COVID-19 12/30/2019   Tobacco dependence 12/30/2019   Thrombocytopenia (HCC) 01/31/2019   Overweight (BMI 25.0-29.9) 01/31/2019   GERD (gastroesophageal reflux disease) 01/15/2018   Emphysema, unspecified (HCC) 05/29/2017   Pulmonary nodule, right 05/29/2017   Lymphadenitis 05/16/2017   Chronic pain of right ankle 03/14/2016   Anxiety 09/12/2015   Medication monitoring encounter 05/18/2015   Tobacco abuse 04/11/2015   Hypertension    Vitamin D deficiency disease    Dermatitis      Physical Exam  Triage Vital Signs: ED Triage Vitals  Enc Vitals Group     BP 12/12/21 2233 (!) 201/101     Pulse Rate 12/12/21 2233 92     Resp 12/12/21 2233 18     Temp 12/12/21 2233 98.7 F (37.1 C)     Temp Source 12/12/21 2233 Oral     SpO2 12/12/21 2233 100 %     Weight 12/12/21 2232 160 lb (72.6 kg)     Height 12/12/21 2232 5\' 9"  (1.753 m)     Head Circumference --      Peak Flow --      Pain Score 12/12/21 2231 7     Pain Loc --      Pain Edu? --      Excl. in GC? --     Most recent vital signs: Vitals:   12/12/21 2233  BP: (!)  201/101  Pulse: 92  Resp: 18  Temp: 98.7 F (37.1 C)  SpO2: 100%     General: Awake, no distress.  CV:  Good peripheral perfusion.  Resp:  Normal effort.  Abd:  No distention.  Abdomen is soft and nontender Neuro:             Awake, Alert, Oriented x 3  Other:  Fully reduced left inguinal hernia, able to palpate defect in the inguinal region   ED Results / Procedures / Treatments  Labs (all labs ordered are listed, but only abnormal results are displayed) Labs Reviewed  CBC WITH DIFFERENTIAL/PLATELET - Abnormal; Notable for the following components:      Result Value   MCV 100.5 (*)    Platelets 142 (*)    All other components within normal limits  COMPREHENSIVE METABOLIC PANEL - Abnormal; Notable for the following components:   Total Bilirubin 1.5 (*)    All other components within normal limits  URINALYSIS, ROUTINE W REFLEX MICROSCOPIC - Abnormal; Notable for the following components:   Color, Urine  YELLOW (*)    APPearance CLOUDY (*)    Protein, ur 100 (*)    Bacteria, UA MANY (*)    All other components within normal limits     EKG     RADIOLOGY    PROCEDURES:  Critical Care performed: No  Procedures   MEDICATIONS ORDERED IN ED: Medications - No data to display   IMPRESSION / MDM / ASSESSMENT AND PLAN / ED COURSE  I reviewed the triage vital signs and the nursing notes.                              Patient's presentation is most consistent with acute, uncomplicated illness.  Differential diagnosis includes, but is not limited to, direct inguinal hernia indirect inguinal hernia, less likely incarcerated or strangulated hernia  The patient is a 51 year old male presents with a bulge in the left inguinal region that increased in size today.  He has noticed this over the last day or 2, and it increased in size to what he describes as like a plum after he was holding his dog back.  No abdominal pain or obstructive symptoms.  On exam he has what  appears to be a fully reduced left inguinal hernia but I am able to palpate the defect.  Abdomen is benign.  No indication for imaging this is not incarcerated or strangulated.  Will refer to general surgery as an outpatient.  We discussed reasons to return including signs of incarceration or strangulation.       FINAL CLINICAL IMPRESSION(S) / ED DIAGNOSES   Final diagnoses:  Unilateral inguinal hernia without obstruction or gangrene, recurrence not specified     Rx / DC Orders   ED Discharge Orders     None        Note:  This document was prepared using Dragon voice recognition software and may include unintentional dictation errors.   Georga Hacking, MD 12/13/21 [phone]

## 2022-01-02 ENCOUNTER — Ambulatory Visit: Payer: Self-pay | Admitting: Surgery

## 2022-01-15 ENCOUNTER — Other Ambulatory Visit: Payer: Self-pay

## 2022-01-15 ENCOUNTER — Emergency Department
Admission: EM | Admit: 2022-01-15 | Discharge: 2022-01-16 | Disposition: A | Discharge status: Home / Self Care | Payer: Medicaid Other | Attending: Student in an Organized Health Care Education/Training Program | Admitting: Student in an Organized Health Care Education/Training Program

## 2022-01-15 DIAGNOSIS — J449 Chronic obstructive pulmonary disease, unspecified: Secondary | ICD-10-CM | POA: Insufficient documentation

## 2022-01-15 DIAGNOSIS — F1090 Alcohol use, unspecified, uncomplicated: Secondary | ICD-10-CM

## 2022-01-15 DIAGNOSIS — Z20822 Contact with and (suspected) exposure to covid-19: Secondary | ICD-10-CM | POA: Insufficient documentation

## 2022-01-15 DIAGNOSIS — Y908 Blood alcohol level of 240 mg/100 ml or more: Secondary | ICD-10-CM | POA: Insufficient documentation

## 2022-01-15 DIAGNOSIS — R45851 Suicidal ideations: Secondary | ICD-10-CM

## 2022-01-15 DIAGNOSIS — F1010 Alcohol abuse, uncomplicated: Secondary | ICD-10-CM | POA: Insufficient documentation

## 2022-01-15 LAB — COMPREHENSIVE METABOLIC PANEL
ALT: 14 U/L (ref 0–44)
AST: 22 U/L (ref 15–41)
Albumin: 4.4 g/dL (ref 3.5–5.0)
Alkaline Phosphatase: 74 U/L (ref 38–126)
Anion gap: 12 (ref 5–15)
BUN: 16 mg/dL (ref 6–20)
CO2: 24 mmol/L (ref 22–32)
Calcium: 9.4 mg/dL (ref 8.9–10.3)
Chloride: 107 mmol/L (ref 98–111)
Creatinine, Ser: 0.79 mg/dL (ref 0.61–1.24)
GFR, Estimated: >60 mL/min (ref >60)
Glucose, Bld: 102 mg/dL — ABNORMAL HIGH (ref 70–99)
Potassium: 3.7 mmol/L (ref 3.5–5.1)
Sodium: 143 mmol/L (ref 135–145)
Total Bilirubin: 0.5 mg/dL (ref 0.3–1.2)
Total Protein: 7.6 g/dL (ref 6.5–8.1)

## 2022-01-15 LAB — CBC
HCT: 49.1 % (ref 39.0–52.0)
Hemoglobin: 16.8 g/dL (ref 13.0–17.0)
MCH: 33.9 pg (ref 26.0–34.0)
MCHC: 34.2 g/dL (ref 30.0–36.0)
MCV: 99.2 fL (ref 80.0–100.0)
Platelets: 271 10*3/uL (ref 150–400)
RBC: 4.95 MIL/uL (ref 4.22–5.81)
RDW: 12.9 % (ref 11.5–15.5)
WBC: 8.7 10*3/uL (ref 4.0–10.5)
nRBC: 0 % (ref 0.0–0.2)

## 2022-01-15 LAB — SALICYLATE LEVEL: Salicylate Lvl: <7 mg/dL — ABNORMAL LOW (ref 7.0–30.0)

## 2022-01-15 LAB — ACETAMINOPHEN LEVEL: Acetaminophen (Tylenol), Serum: <10 ug/mL — ABNORMAL LOW (ref 10–30)

## 2022-01-15 LAB — ETHANOL: Alcohol, Ethyl (B): 313 mg/dL (ref <10)

## 2022-01-15 MED ORDER — ALBUTEROL SULFATE HFA 108 (90 BASE) MCG/ACT IN AERS
2.0000 | INHALATION_SPRAY | Freq: Four times a day (QID) | RESPIRATORY_TRACT | Status: DC | PRN
  Filled 2022-01-15: qty 6.7

## 2022-01-15 MED ORDER — AMLODIPINE BESYLATE 5 MG PO TABS
10.0000 mg | ORAL_TABLET | Freq: Every day | ORAL | Status: DC
  Administered 2022-01-16: 10 mg via ORAL
  Filled 2022-01-15: qty 2

## 2022-01-15 NOTE — ED Notes (Signed)
EDP Robinson at bedside.  

## 2022-01-15 NOTE — ED Notes (Signed)
Pt verbalized understanding as to why he needs to see psych team before leaving. Pt continues to have concern over his job; this RN reiterated how important it is that pt concentrate on his personal health right now. Pt requesting process move as quickly as possible so he can get to work on time. Told pt will try to keep process flowing as smoothly as possible.

## 2022-01-15 NOTE — ED Notes (Signed)
Pt dressed out, the following items placed in one of one labeled bag: black sneakers, grey t shirt, black and white shorts.

## 2022-01-15 NOTE — ED Notes (Signed)
Security personnel notified in person that pt IVC.

## 2022-01-15 NOTE — ED Notes (Signed)
Pt states he drank his normal amount for the day right before coming in; educated on withdrawal s/s and pt agreeable to notify this RN if he starts experiencing any of these symptoms; pt reports chronic slight tremor which is currently noted; pt reports this tremor runs in his family; pt reports SI but denies specific plan; denies HI; pt reports stressors in life causing him to drink; pt won't go into detail on these stressors; pt tearful at times. Pt's resp reg/unlabored, skin dry and calmly/cooperatively laying on stretcher. Pt given drink and food tray.

## 2022-01-15 NOTE — ED Triage Notes (Signed)
Per pd pt has "Been having some self destructive thoughts and has been drinking pretty heavily today". Pt will not answer questions in triage.

## 2022-01-15 NOTE — ED Notes (Signed)
Pt states he needs his wallet; when asked why pt states "I need to leave because I have work tomorrow". Explained to pt he must see psych team first since he is IVC.

## 2022-01-15 NOTE — ED Provider Notes (Signed)
[facility] Provider Note    Event Date/Time   First MD Initiated Contact with Patient 01/15/22 2144     (approximate)   History   Suicidal   HPI  Anthony Cardenas. is a 51 y.o. male   history of smoking and COPD presents to the ER brought in by police.  Reportedly intoxicated.  Reportedly made some comments to police indicating that he was suicidal.  Patient not wanting to talk about why he is here.  He is eating dinner.      Physical Exam   Triage Vital Signs: ED Triage Vitals  Enc Vitals Group     BP 01/15/22 2130 (!) 168/109     Pulse Rate 01/15/22 2130 (!) 114     Resp 01/15/22 2130 20     Temp 01/15/22 2130 98.1 F (36.7 C)     Temp Source 01/15/22 2130 Oral     SpO2 01/15/22 2130 95 %     Weight 01/15/22 2131 163 lb 2.3 oz (74 kg)     Height 01/15/22 2131 5\' 9"  (1.753 m)     Head Circumference --      Peak Flow --      Pain Score 01/15/22 2131 0     Pain Loc --      Pain Edu? --      Excl. in GC? --     Most recent vital signs: Vitals:   01/15/22 2130  BP: (!) 168/109  Pulse: (!) 114  Resp: 20  Temp: 98.1 F (36.7 C)  SpO2: 95%     Constitutional: Alert  Eyes: Conjunctivae are normal.  Head: Atraumatic. Nose: No congestion/rhinnorhea. Mouth/Throat: Mucous membranes are moist.   Neck: Painless ROM.  Cardiovascular:   Good peripheral circulation. Respiratory: Normal respiratory effort.  No retractions.  Gastrointestinal: Soft and nontender.  Musculoskeletal:  no deformity Neurologic:  MAE spontaneously. No gross focal neurologic deficits are appreciated.  Skin:  Skin is warm, dry and intact. No rash noted. Psychiatric: Mood and affect are normal. Speech and behavior are normal.    ED Results / Procedures / Treatments   Labs (all labs ordered are listed, but only abnormal results are displayed) Labs Reviewed  COMPREHENSIVE METABOLIC PANEL - Abnormal; Notable for the following components:      Result Value    Glucose, Bld 102 (*)    All other components within normal limits  ETHANOL - Abnormal; Notable for the following components:   Alcohol, Ethyl (B) 313 (*)    All other components within normal limits  SALICYLATE LEVEL - Abnormal; Notable for the following components:   Salicylate Lvl <7.0 (*)    All other components within normal limits  ACETAMINOPHEN LEVEL - Abnormal; Notable for the following components:   Acetaminophen (Tylenol), Serum <10 (*)    All other components within normal limits  CBC  URINE DRUG SCREEN, QUALITATIVE ([facility] ONLY)     EKG   RADIOLOGY   PROCEDURES:  Critical Care performed: No  Procedures   MEDICATIONS ORDERED IN ED: Medications - No data to display   IMPRESSION / MDM / ASSESSMENT AND PLAN / ED COURSE  I reviewed the triage vital signs and the nursing notes.                              Differential diagnosis includes, but is not limited to, Psychosis, delirium, medication effect, noncompliance, polysubstance abuse, Si,  Hi, depression  Patient here for evaluation of intoxication and SI.  Patient has history of alcohol abuse.  Laboratory testing was ordered to evaluation for underlying electrolyte derangement or signs of underlying organic pathology to explain today's presentation.   Patient is intoxicated.  Remainder of blood work normal.  Patient IVC disease uncooperative with exam would not provide any additional history given his report of suicidal ideation.  He is clinically stable and medically cleared for psychiatric evaluation.    The patient has been placed in psychiatric observation due to the need to provide a safe environment for the patient while obtaining psychiatric consultation and evaluation, as well as ongoing medical and medication management to treat the patient's condition.  The patient has been placed under full IVC at this time.       FINAL CLINICAL IMPRESSION(S) / ED DIAGNOSES   Final diagnoses:  Suicidal ideation   Alcohol abuse     Rx / DC Orders   ED Discharge Orders     None        Note:  This document was prepared using Dragon voice recognition software and may include unintentional dictation errors.    Merlyn Lot, MD 01/15/22 [phone]

## 2022-01-15 NOTE — ED Notes (Signed)
Pt requested Anthony Cardenas business card from his wallet for phone number to call and notify work that he may be delayed or have to call out. Card given to pt as well as hospital phone.

## 2022-01-15 NOTE — ED Notes (Signed)
Provider Quentin Cornwall notified via secure chat of [phone].

## 2022-01-15 NOTE — ED Triage Notes (Signed)
FIRST NURSE NOTE:  Pt arrived via [place] [employer] states he is here because of his drinking, pt arrived voluntarily.

## 2022-01-16 DIAGNOSIS — F1090 Alcohol use, unspecified, uncomplicated: Secondary | ICD-10-CM

## 2022-01-16 LAB — URINE DRUG SCREEN, QUALITATIVE (ARMC ONLY)
Amphetamines, Ur Screen: NOT DETECTED
Barbiturates, Ur Screen: NOT DETECTED
Benzodiazepine, Ur Scrn: NOT DETECTED
Cannabinoid 50 Ng, Ur ~~LOC~~: NOT DETECTED
Cocaine Metabolite,Ur ~~LOC~~: NOT DETECTED
MDMA (Ecstasy)Ur Screen: NOT DETECTED
Methadone Scn, Ur: NOT DETECTED
Opiate, Ur Screen: NOT DETECTED
Phencyclidine (PCP) Ur S: NOT DETECTED
Tricyclic, Ur Screen: NOT DETECTED

## 2022-01-16 LAB — RESP PANEL BY RT-PCR (FLU A&B, COVID) ARPGX2
Influenza A by PCR: NEGATIVE
Influenza B by PCR: NEGATIVE
SARS Coronavirus 2 by RT PCR: NEGATIVE

## 2022-01-16 MED ORDER — LORAZEPAM 1 MG PO TABS: 1.0000 mg | ORAL_TABLET | Freq: Three times a day (TID) | ORAL | 0 refills | Status: AC | PRN

## 2022-01-16 MED ORDER — NICOTINE 21 MG/24HR TD PT24
21.0000 mg | MEDICATED_PATCH | Freq: Once | TRANSDERMAL | Status: DC
  Filled 2022-01-16: qty 1

## 2022-01-16 MED ORDER — LORAZEPAM 2 MG PO TABS
2.0000 mg | ORAL_TABLET | Freq: Once | ORAL | Status: AC
  Administered 2022-01-16: 2 mg via ORAL
  Filled 2022-01-16: qty 1

## 2022-01-16 NOTE — ED Notes (Signed)
Dc instructions and scripts reviewed with pt no questions or concerns at this time. Will follow up with [facility]. Pt to lobby to call ride. No other needs a tthis time. Pt is alert and orient calm and cooperative.

## 2022-01-16 NOTE — ED Provider Notes (Addendum)
Patient seen by psych not felt to be in need of inpatient treatment.  I will give him follow-up with [facility].  Additionally since he is getting hypertensive and a little shaky I will give him some Ativan here and 6 more Ativan pills 1 mg each to take 3 times a day starting at noon today if he needs it.   Nena Polio, MD 01/16/22 [phone] And does say he has a normal tremor maybe it is a little bit worse now with not having had any alcohol.  He feels fine has never had DTs.  He is no longer suicidal he says.   Nena Polio, MD 01/16/22 [phone]

## 2022-01-16 NOTE — ED Provider Notes (Signed)
Emergency Medicine Observation Re-evaluation Note  Adriel Kessen. is a 51 y.o. male, seen on rounds today.  Pt initially presented to the ED for complaints of Suicidal Currently, the patient is resting, voices no medical complaints.  Physical Exam  BP (!) 168/109   Pulse (!) 114   Temp 98.1 F (36.7 C) (Oral)   Resp 20   Ht 5\' 9"  (1.753 m)   Wt 74 kg   SpO2 95%   BMI 24.09 kg/m  Physical Exam General: Resting in no acute distress Cardiac: No cyanosis Lungs: Equal rise and fall Psych: Not agitated  ED Course / MDM  EKG:   I have reviewed the labs performed to date as well as medications administered while in observation.  Recent changes in the last 24 hours include patient's willingly received oral Ativan for agitation.  This was given with good success.  Plan  Current plan is for psychiatric evaluation and disposition.    Paulette Blanch, MD 01/16/22 [phone]

## 2022-01-16 NOTE — ED Notes (Signed)
Pt awake and requesting consult from psych.

## 2022-01-16 NOTE — Discharge Instructions (Addendum)
Please try to cut back on your alcohol use or stop.  Please return here if you get very shaky here jittery like you having any withdrawal.  Is follow-up with your regular doctor.  If you have trouble stopping the alcohol you can try AA.  This probably the best thing we have for alcohol. I will give you just a little bit of Ativan to help get off of the alcohol.  Start taking them at noon today and take 1 pill 3 times a day as needed for shakiness or jitteriness.  If it continues after you run out of the Ativan please return here. You can also follow-up with [facility].  There contact information is on the other sheet of paper we gave you.

## 2022-01-16 NOTE — ED Notes (Addendum)
Pt repeatedly requesting to leave and this RN reminded pt that IVC paperwork has been filed. Pt reminded of IVC process and plan of care. Pt exited bed and attempted to walk out of unit. Pt stopped by nursing staff and security.

## 2022-01-16 NOTE — Consult Note (Signed)
[facility] Face-to-Face Psychiatry Consult   Reason for Consult:  alcohol intoxication Referring Physician:  EDP Patient Identification: Anthony Cardenas. MRN:  [id number] Principal Diagnosis: Alcohol use disorder Diagnosis:  Principal Problem:   Alcohol use disorder   Total Time spent with patient: 30 minutes  Subjective:   Anthony Cardenas. is a 51 y.o. male patient admitted with alcohol intoxication.  HPI:  Patient presented to ED with a BAL level of 313 last evening. Today, he appears to be clinically sober. Patient says he does have a "problem" with alcohol use. He denies any thought or intent or suicide and says he only came to the ED because he was vomiting. He reports planning to go to [facility] for help with alcohol use disorder. He also states he has a list of treatment centers if necessary. He denies suicidal thoughts or intent. Denies homicidal thoughts, paranoia, auditory or visual hallucinations. He does not appear to be responding to internal stimuli.  He does not desire or require inpatient psychiatric hospitalization   Past Psychiatric History: anxiety  Risk to Self:   Risk to Others:   Prior Inpatient Therapy:   Prior Outpatient Therapy:    Past Medical History:  Past Medical History:  Diagnosis Date   COPD (chronic obstructive pulmonary disease) (HCC)    Dermatitis    Hypertension    Migraines    Vitamin D deficiency disease    9.1 in Feb 2016    Past Surgical History:  Procedure Laterality Date   DENTAL SURGERY  2011   Family History:  Family History  Problem Relation Age of Onset   Cancer Mother        lung   COPD Mother    Asthma Mother    Hypertension Mother    Cancer Paternal Grandfather        lung   Heart disease Maternal Grandmother    Hypertension Maternal Grandmother    Stroke Maternal Grandmother    Diabetes Neg Hx    Family Psychiatric  History:  Social History:  Social History   Substance and Sexual Activity  Alcohol Use Yes   Comment:  daily     Social History   Substance and Sexual Activity  Drug Use No    Social History   Socioeconomic History   Marital status: Married    Spouse name: Not on file   Number of children: Not on file   Years of education: Not on file   Highest education level: Not on file  Occupational History    Employer: [employer]  Tobacco Use   Smoking status: Every Day    Packs/day: 1.00    Types: Cigarettes   Smokeless tobacco: Never  Vaping Use   Vaping Use: Never used  Substance and Sexual Activity   Alcohol use: Yes    Comment: daily   Drug use: No   Sexual activity: Yes  Other Topics Concern   Not on file  Social History Narrative   Not on file   Social Determinants of Health   Financial Resource Strain: Not on file  Food Insecurity: Not on file  Transportation Needs: Not on file  Physical Activity: Not on file  Stress: Not on file  Social Connections: Not on file   Additional Social History:    Allergies:   Allergies  Allergen Reactions   Chantix [Varenicline] Other (See Comments)    Bad dreams, anxiety    Labs:  Results for orders placed or performed during the  hospital encounter of 01/15/22 (from the past 48 hour(s))  Comprehensive metabolic panel     Status: Abnormal   Collection Time: 01/15/22  9:34 PM  Result Value Ref Range   Sodium 143 135 - 145 mmol/L   Potassium 3.7 3.5 - 5.1 mmol/L   Chloride 107 98 - 111 mmol/L   CO2 24 22 - 32 mmol/L   Glucose, Bld 102 (H) 70 - 99 mg/dL    Comment: Glucose reference range applies only to samples taken after fasting for at least 8 hours.   BUN 16 6 - 20 mg/dL   Creatinine, Ser 1.470.79 0.61 - 1.24 mg/dL   Calcium 9.4 8.9 - 82.910.3 mg/dL   Total Protein 7.6 6.5 - 8.1 g/dL   Albumin 4.4 3.5 - 5.0 g/dL   AST 22 15 - 41 U/L   ALT 14 0 - 44 U/L   Alkaline Phosphatase 74 38 - 126 U/L   Total Bilirubin 0.5 0.3 - 1.2 mg/dL   GFR, Estimated >56>60 >21>60 mL/min    Comment: (NOTE) Calculated using the CKD-EPI Creatinine  Equation (2021)    Anion gap 12 5 - 15    Comment: Performed at [facility], [address] [place], [place] [address]  Ethanol     Status: Abnormal   Collection Time: 01/15/22  9:34 PM  Result Value Ref Range   Alcohol, Ethyl (B) 313 (HH) <10 mg/dL    Comment: CRITICAL RESULT CALLED TO, READ BACK BY AND VERIFIED WITH [place] HAHLE AT 2218 01/15/2022 DLB (NOTE) Lowest detectable limit for serum alcohol is 10 mg/dL.  For medical purposes only. Performed at [facility], [address] [place], [place] [address]   Salicylate level     Status: Abnormal   Collection Time: 01/15/22  9:34 PM  Result Value Ref Range   Salicylate Lvl <7.0 (L) 7.0 - 30.0 mg/dL    Comment: Performed at [facility], [address] [place], [place] [address]  Acetaminophen level     Status: Abnormal   Collection Time: 01/15/22  9:34 PM  Result Value Ref Range   Acetaminophen (Tylenol), Serum <10 (L) 10 - 30 ug/mL    Comment: (NOTE) Therapeutic concentrations vary significantly. A range of 10-30 ug/mL  may be an effective concentration for many patients. However, some  are best treated at concentrations outside of this range. Acetaminophen concentrations >150 ug/mL at 4 hours after ingestion  and >50 ug/mL at 12 hours after ingestion are often associated with  toxic reactions.  Performed at [facility], [address] [place], [place] [address]   cbc     Status: None   Collection Time: 01/15/22  9:34 PM  Result Value Ref Range   WBC 8.7 4.0 - 10.5 K/uL   RBC 4.95 4.22 - 5.81 MIL/uL   Hemoglobin 16.8 13.0 - 17.0 g/dL   HCT 44.049.1 10.239.0 - 72.552.0 %   MCV 99.2 80.0 - 100.0 fL   MCH 33.9 26.0 - 34.0 pg   MCHC 34.2 30.0 - 36.0 g/dL   RDW 36.612.9 44.011.5 - 34.715.5 %   Platelets 271 150 - 400 K/uL   nRBC 0.0 0.0 - 0.2 %    Comment: Performed at [facility], [address] [place], [place] [address]  Urine Drug Screen, Qualitative     Status: None   Collection  Time: 01/16/22 12:11 AM  Result Value Ref Range   Tricyclic, Ur Screen NONE DETECTED NONE DETECTED   Amphetamines,  Ur Screen NONE DETECTED NONE DETECTED   MDMA (Ecstasy)Ur Screen NONE DETECTED NONE DETECTED   Cocaine Metabolite,Ur [place] NONE DETECTED NONE DETECTED   Opiate, Ur Screen NONE DETECTED NONE DETECTED   Phencyclidine (PCP) Ur S NONE DETECTED NONE DETECTED   Cannabinoid 50 Ng, Ur [place] NONE DETECTED NONE DETECTED   Barbiturates, Ur Screen NONE DETECTED NONE DETECTED   Benzodiazepine, Ur Scrn NONE DETECTED NONE DETECTED   Methadone Scn, Ur NONE DETECTED NONE DETECTED    Comment: (NOTE) Tricyclics + metabolites, urine    Cutoff 1000 ng/mL Amphetamines + metabolites, urine  Cutoff 1000 ng/mL MDMA (Ecstasy), urine              Cutoff 500 ng/mL Cocaine Metabolite, urine          Cutoff 300 ng/mL Opiate + metabolites, urine        Cutoff 300 ng/mL Phencyclidine (PCP), urine         Cutoff 25 ng/mL Cannabinoid, urine                 Cutoff 50 ng/mL Barbiturates + metabolites, urine  Cutoff 200 ng/mL Benzodiazepine, urine              Cutoff 200 ng/mL Methadone, urine                   Cutoff 300 ng/mL  The urine drug screen provides only a preliminary, unconfirmed analytical test result and should not be used for non-medical purposes. Clinical consideration and professional judgment should be applied to any positive drug screen result due to possible interfering substances. A more specific alternate chemical method must be used in order to obtain a confirmed analytical result. Gas chromatography / mass spectrometry (GC/MS) is the preferred confirm atory method. Performed at [facility], [address] [place], [place] [address]   Resp Panel by RT-PCR (Flu A&B, Covid) Anterior Nasal Swab     Status: None   Collection Time: 01/16/22 12:11 AM   Specimen: Anterior Nasal Swab  Result Value Ref Range   SARS Coronavirus 2 by RT PCR NEGATIVE NEGATIVE    Comment:  (NOTE) SARS-CoV-2 target nucleic acids are NOT DETECTED.  The SARS-CoV-2 RNA is generally detectable in upper respiratory specimens during the acute phase of infection. The lowest concentration of SARS-CoV-2 viral copies this assay can detect is 138 copies/mL. A negative result does not preclude SARS-Cov-2 infection and should not be used as the sole basis for treatment or other patient management decisions. A negative result may occur with  improper specimen collection/handling, submission of specimen other than nasopharyngeal swab, presence of viral mutation(s) within the areas targeted by this assay, and inadequate number of viral copies(<138 copies/mL). A negative result must be combined with clinical observations, patient history, and epidemiological information. The expected result is Negative.  Fact Sheet for Patients:  EntrepreneurPulse.com.au  Fact Sheet for Healthcare Providers:  IncredibleEmployment.be  This test is no t yet approved or cleared by the [place] FDA and  has been authorized for detection and/or diagnosis of SARS-CoV-2 by FDA under an Emergency Use Authorization (EUA). This EUA will remain  in effect (meaning this test can be used) for the duration of the COVID-19 declaration under Section 564(b)(1) of the Act, 21 U.S.C.section 360bbb-3(b)(1), unless the authorization is terminated  or revoked sooner.       Influenza A by PCR NEGATIVE NEGATIVE   Influenza B by PCR NEGATIVE NEGATIVE    Comment: (NOTE) The Xpert Xpress  SARS-CoV-2/FLU/RSV plus assay is intended as an aid in the diagnosis of influenza from Nasopharyngeal swab specimens and should not be used as a sole basis for treatment. Nasal washings and aspirates are unacceptable for Xpert Xpress SARS-CoV-2/FLU/RSV testing.  Fact Sheet for Patients: BloggerCourse.com  Fact Sheet for Healthcare  Providers: SeriousBroker.it  This test is not yet approved or cleared by the [place] FDA and has been authorized for detection and/or diagnosis of SARS-CoV-2 by FDA under an Emergency Use Authorization (EUA). This EUA will remain in effect (meaning this test can be used) for the duration of the COVID-19 declaration under Section 564(b)(1) of the Act, 21 U.S.C. section 360bbb-3(b)(1), unless the authorization is terminated or revoked.  Performed at [facility], [address] [place], [place] [address]     No current facility-administered medications for this encounter.   Current Outpatient Medications  Medication Sig Dispense Refill   amLODipine (NORVASC) 10 MG tablet Take 1 tablet by mouth daily.     LORazepam (ATIVAN) 1 MG tablet Take 1 tablet (1 mg total) by mouth every 8 (eight) hours as needed for anxiety. 6 tablet 0   albuterol (VENTOLIN HFA) 108 (90 Base) MCG/ACT inhaler Inhale 2 puffs into the lungs every 4 (four) hours as needed for wheezing or shortness of breath. (Patient not taking: Reported on 06/30/2021) 18 g 3    Musculoskeletal: Strength & Muscle Tone: within normal limits Gait & Station: normal Patient leans: N/A   Psychiatric Specialty Exam:  Presentation  General Appearance: Appropriate for Environment Eye Contact:Good Speech:Clear and Coherent Speech Volume:Normal Handedness:No data recorded  Mood and Affect  Mood:Euthymic Affect:Congruent  Thought Process  Thought Processes:Coherent Descriptions of Associations:Intact  Orientation:Full (Time, Place and Person)  Thought Content:WDL  History of Schizophrenia/Schizoaffective disorder:No  Duration of Psychotic Symptoms:No data recorded Hallucinations:Hallucinations: None  Ideas of Reference:None  Suicidal Thoughts:Suicidal Thoughts: No  Homicidal Thoughts:Homicidal Thoughts: No   Sensorium  Memory:Immediate  Good Judgment:Fair Insight:Fair  Executive Functions  Concentration:Fair Attention Span:Good Recall:Good Fund of Knowledge:Fair Language:Good  Psychomotor Activity  Psychomotor Activity:Psychomotor Activity: Normal  Assets  Assets:Desire for Improvement; Physical Health; Social Support; Resilience  Sleep  Sleep:Sleep: Good  Physical Exam: Physical Exam Vitals and nursing note reviewed.  HENT:     Head: Normocephalic.     Nose: No congestion or rhinorrhea.  Eyes:     General:        Right eye: No discharge.        Left eye: No discharge.  Cardiovascular:     Rate and Rhythm: Normal rate.  Pulmonary:     Effort: Pulmonary effort is normal.  Musculoskeletal:        General: Normal range of motion.     Cervical back: Normal range of motion.  Skin:    General: Skin is dry.  Neurological:     Mental Status: He is alert and oriented to person, place, and time.  Psychiatric:        Attention and Perception: Attention normal.        Mood and Affect: Mood normal.        Speech: Speech normal.        Behavior: Behavior normal. Behavior is cooperative.        Thought Content: Thought content normal. Thought content is not paranoid or delusional. Thought content does not include homicidal or suicidal ideation.        Cognition and Memory: Cognition normal.        Judgment: Judgment normal.  Review of Systems  Constitutional: Negative.   HENT: Negative.    Respiratory: Negative.    Musculoskeletal: Negative.   Skin: Negative.   Psychiatric/Behavioral:  Positive for substance abuse. Negative for depression, hallucinations, memory loss and suicidal ideas. The patient is not nervous/anxious and does not have insomnia.    Blood pressure (!) 189/105, pulse 97, temperature 98.5 F (36.9 C), temperature source Oral, resp. rate 20, height 5\' 9"  (1.753 m), weight 74 kg, SpO2 96 %. Body mass index is 24.09 kg/m.  Treatment Plan Summary: Plan Patient deneis SI/HI/AVH. Admits  that he has problems with alcohol use disorder.  He is requesting information regarding alcohol use disorder resources.  Reviewed with EDP  Alcohol use disorder No evidence of imminent risk to self or others at present.   Patient does not meet criteria for psychiatric inpatient admission. Supportive therapy provided about ongoing stressors.   , NP 01/16/2022 6:07 PM

## 2022-01-16 NOTE — ED Notes (Signed)
Pt given breakfast tray and drink at this time. 

## 2022-01-16 NOTE — ED Notes (Signed)
Pt sitting up drinking sprite. Denies any other needs.
